# Patient Record
Sex: Male | Born: 1980 | Race: White | Hispanic: No | Marital: Single | Smoking: Never smoker
Health system: Southern US, Community
[De-identification: ages and names within clinical notes are randomized; demographics above are authoritative.]

## PROBLEM LIST (undated history)

## (undated) DIAGNOSIS — J302 Other seasonal allergic rhinitis: Secondary | ICD-10-CM

## (undated) HISTORY — PX: TONSILLECTOMY: SUR1361

## (undated) HISTORY — PX: WISDOM TOOTH EXTRACTION: SHX21

## (undated) HISTORY — PX: OTHER SURGICAL HISTORY: SHX169

---

## 2004-02-11 ENCOUNTER — Emergency Department: Payer: Self-pay | Admitting: Emergency Medicine

## 2014-09-03 ENCOUNTER — Ambulatory Visit
Admission: RE | Admit: 2014-09-03 | Discharge: 2014-09-03 | Disposition: A | Discharge status: Home / Self Care | Payer: Self-pay | Source: Ambulatory Visit | Attending: Family Medicine | Admitting: Family Medicine

## 2014-09-03 ENCOUNTER — Other Ambulatory Visit: Payer: Self-pay | Admitting: Family Medicine

## 2014-09-03 DIAGNOSIS — M25562 Pain in left knee: Secondary | ICD-10-CM

## 2014-10-16 ENCOUNTER — Other Ambulatory Visit: Payer: Self-pay | Admitting: Orthopedic Surgery

## 2014-10-16 DIAGNOSIS — S8392XD Sprain of unspecified site of left knee, subsequent encounter: Secondary | ICD-10-CM

## 2014-10-20 ENCOUNTER — Ambulatory Visit
Admission: RE | Admit: 2014-10-20 | Discharge: 2014-10-20 | Disposition: A | Discharge status: Home / Self Care | Payer: 59 | Source: Ambulatory Visit | Attending: Orthopedic Surgery | Admitting: Orthopedic Surgery

## 2014-10-20 DIAGNOSIS — X58XXXD Exposure to other specified factors, subsequent encounter: Secondary | ICD-10-CM | POA: Insufficient documentation

## 2014-10-20 DIAGNOSIS — S83512D Sprain of anterior cruciate ligament of left knee, subsequent encounter: Secondary | ICD-10-CM | POA: Insufficient documentation

## 2014-10-20 DIAGNOSIS — S8392XD Sprain of unspecified site of left knee, subsequent encounter: Secondary | ICD-10-CM

## 2014-11-07 ENCOUNTER — Other Ambulatory Visit (HOSPITAL_COMMUNITY): Payer: Self-pay | Admitting: *Deleted

## 2014-11-07 ENCOUNTER — Encounter (HOSPITAL_COMMUNITY)
Admission: RE | Admit: 2014-11-07 | Discharge: 2014-11-07 | Disposition: A | Discharge status: Home / Self Care | Payer: 59 | Source: Ambulatory Visit | Attending: Orthopedic Surgery | Admitting: Orthopedic Surgery

## 2014-11-07 ENCOUNTER — Encounter (HOSPITAL_COMMUNITY): Payer: Self-pay

## 2014-11-07 DIAGNOSIS — X58XXXA Exposure to other specified factors, initial encounter: Secondary | ICD-10-CM | POA: Diagnosis not present

## 2014-11-07 DIAGNOSIS — S83512A Sprain of anterior cruciate ligament of left knee, initial encounter: Secondary | ICD-10-CM | POA: Diagnosis present

## 2014-11-07 DIAGNOSIS — Y999 Unspecified external cause status: Secondary | ICD-10-CM | POA: Diagnosis not present

## 2014-11-07 DIAGNOSIS — Y929 Unspecified place or not applicable: Secondary | ICD-10-CM | POA: Diagnosis not present

## 2014-11-07 DIAGNOSIS — Y939 Activity, unspecified: Secondary | ICD-10-CM | POA: Diagnosis not present

## 2014-11-07 HISTORY — DX: Other seasonal allergic rhinitis: J30.2

## 2014-11-07 LAB — COMPREHENSIVE METABOLIC PANEL
ALK PHOS: 83 U/L (ref 38–126)
ALT: 63 U/L (ref 17–63)
AST: 49 U/L — ABNORMAL HIGH (ref 15–41)
Albumin: 4 g/dL (ref 3.5–5.0)
Anion gap: 9 (ref 5–15)
BUN: 16 mg/dL (ref 6–20)
CALCIUM: 9.1 mg/dL (ref 8.9–10.3)
CO2: 25 mmol/L (ref 22–32)
Chloride: 105 mmol/L (ref 101–111)
Creatinine, Ser: 1.06 mg/dL (ref 0.61–1.24)
GFR calc Af Amer: >60 mL/min (ref >60)
Glucose, Bld: 86 mg/dL (ref 65–99)
Potassium: 4.1 mmol/L (ref 3.5–5.1)
SODIUM: 139 mmol/L (ref 135–145)
Total Bilirubin: 0.8 mg/dL (ref 0.3–1.2)
Total Protein: 6.4 g/dL — ABNORMAL LOW (ref 6.5–8.1)

## 2014-11-07 LAB — CBC WITH DIFFERENTIAL/PLATELET
Basophils Absolute: 0 10*3/uL (ref 0.0–0.1)
Basophils Relative: 0 % (ref 0–1)
Eosinophils Absolute: 0.2 10*3/uL (ref 0.0–0.7)
Eosinophils Relative: 1 % (ref 0–5)
HCT: 43.6 % (ref 39.0–52.0)
Hemoglobin: 15.3 g/dL (ref 13.0–17.0)
LYMPHS PCT: 29 % (ref 12–46)
Lymphs Abs: 3 10*3/uL (ref 0.7–4.0)
MCH: 31.5 pg (ref 26.0–34.0)
MCHC: 35.1 g/dL (ref 30.0–36.0)
MCV: 89.7 fL (ref 78.0–100.0)
Monocytes Absolute: 0.8 10*3/uL (ref 0.1–1.0)
Monocytes Relative: 7 % (ref 3–12)
NEUTROS PCT: 63 % (ref 43–77)
Neutro Abs: 6.6 10*3/uL (ref 1.7–7.7)
PLATELETS: 247 10*3/uL (ref 150–400)
RBC: 4.86 MIL/uL (ref 4.22–5.81)
RDW: 11.7 % (ref 11.5–15.5)
WBC: 10.5 10*3/uL (ref 4.0–10.5)

## 2014-11-07 LAB — PROTIME-INR
INR: 0.99 (ref 0.00–1.49)
Prothrombin Time: 13.3 seconds (ref 11.6–15.2)

## 2014-11-07 LAB — APTT: APTT: 29 s (ref 24–37)

## 2014-11-07 MED ORDER — CEFAZOLIN SODIUM-DEXTROSE 2-3 GM-% IV SOLR
2.0000 g | INTRAVENOUS | Status: AC
  Administered 2014-11-08: 2 g via INTRAVENOUS

## 2014-11-07 MED ORDER — LACTATED RINGERS IV SOLN
INTRAVENOUS | Status: DC
Start: 2014-11-08 — End: 2014-11-08
  Administered 2014-11-08: 07:00:00 via INTRAVENOUS

## 2014-11-07 NOTE — Pre-Procedure Instructions (Signed)
Douglas CoupChristopher M Roberson  11/07/2014     Your procedure is scheduled on Thursday, November 08, 2014 at 7:30 AM.   [facility] Entr[facility] "A" Admitting Office at 5:30 AM.   Call this number if you have problems the morning of surgery: [phone]    Remember:  Do not eat food or drink liquids after midnight tonight.    Do not wear jewelry.  Do not wear lotions, powders, or cologne.  You may wear deodorant.  Men may shave face and neck.  Do not bring valuables to the hospital.  [facility] is not responsible for any belongings or valuables.  Contacts, dentures or bridgework may not be worn into surgery.  Leave your suitcase in the car.  After surgery it may be brought to your room.  For patients admitted to the hospital, discharge time will be determined by your treatment team.  Patients discharged the day of surgery will not be allowed to drive home.   Special instructions:   - Preparing for Surgery  Before surgery, you can play an important role.  Because skin is not sterile, your skin needs to be as free of germs as possible.  You can reduce the number of germs on you skin by washing with CHG (chlorahexidine gluconate) soap before surgery.  CHG is an antiseptic cleaner which kills germs and bonds with the skin to continue killing germs even after washing.  Please DO NOT use if you have an allergy to CHG or antibacterial soaps.  If your skin becomes reddened/irritated stop using the CHG and inform your nurse when you arrive at Short Stay.  Do not shave (including legs and underarms) for at least 48 hours prior to the first CHG shower.  You may shave your face.  Please follow these instructions carefully:   1.  Shower with CHG Soap the night before surgery and the                                morning of Surgery.  2.  If you choose to wash your hair, wash your hair first as usual with your       normal shampoo.  3.  After you shampoo, rinse your hair and  body thoroughly to remove the                      Shampoo.  4.  Use CHG as you would any other liquid soap.  You can apply chg directly       to the skin and wash gently with scrungie or a clean washcloth.  5.  Apply the CHG Soap to your body ONLY FROM THE NECK DOWN.        Do not use on open wounds or open sores.  Avoid contact with your eyes, ears, mouth and genitals (private parts).  Wash genitals (private parts) with your normal soap.  6.  Wash thoroughly, paying special attention to the area where your surgery        will be performed.  7.  Thoroughly rinse your body with warm water from the neck down.  8.  DO NOT shower/wash with your normal soap after using and rinsing off       the CHG Soap.  9.  Pat yourself dry with a clean towel.            10.  Wear  clean pajamas.            11.  Place clean sheets on your bed the night of your first shower and do not        sleep with pets.  Day of Surgery  Do not apply any lotions the morning of surgery.  Please wear clean clothes to the hospital.    Please read over the following fact sheets that you were given. Pain Booklet, Coughing and Deep Breathing and Surgical Site Infection Prevention

## 2014-11-08 ENCOUNTER — Encounter (HOSPITAL_COMMUNITY): Payer: Self-pay | Admitting: *Deleted

## 2014-11-08 ENCOUNTER — Ambulatory Visit (HOSPITAL_COMMUNITY)
Admission: RE | Admit: 2014-11-08 | Discharge: 2014-11-08 | Disposition: A | Discharge status: Home / Self Care | Payer: 59 | Source: Ambulatory Visit | Attending: Orthopedic Surgery | Admitting: Orthopedic Surgery

## 2014-11-08 ENCOUNTER — Encounter (HOSPITAL_COMMUNITY)
Admission: RE | Disposition: A | Discharge status: Home / Self Care | Payer: Self-pay | Source: Ambulatory Visit | Attending: Orthopedic Surgery

## 2014-11-08 ENCOUNTER — Ambulatory Visit (HOSPITAL_COMMUNITY): Payer: 59 | Admitting: Anesthesiology

## 2014-11-08 DIAGNOSIS — X58XXXA Exposure to other specified factors, initial encounter: Secondary | ICD-10-CM | POA: Insufficient documentation

## 2014-11-08 DIAGNOSIS — Y999 Unspecified external cause status: Secondary | ICD-10-CM | POA: Insufficient documentation

## 2014-11-08 DIAGNOSIS — Y929 Unspecified place or not applicable: Secondary | ICD-10-CM | POA: Insufficient documentation

## 2014-11-08 DIAGNOSIS — S83512A Sprain of anterior cruciate ligament of left knee, initial encounter: Secondary | ICD-10-CM | POA: Insufficient documentation

## 2014-11-08 DIAGNOSIS — Z9889 Other specified postprocedural states: Secondary | ICD-10-CM

## 2014-11-08 DIAGNOSIS — Y939 Activity, unspecified: Secondary | ICD-10-CM | POA: Insufficient documentation

## 2014-11-08 HISTORY — PX: ANTERIOR CRUCIATE LIGAMENT REPAIR: SHX115

## 2014-11-08 SURGERY — RECONSTRUCTION, KNEE, ACL
Anesthesia: General | Site: Knee | Laterality: Left

## 2014-11-08 MED ORDER — DIAZEPAM 5 MG PO TABS
2.5000 mg | ORAL_TABLET | Freq: Four times a day (QID) | ORAL | Status: DC | PRN
Start: 2014-11-08 — End: 2023-03-05

## 2014-11-08 MED ORDER — SODIUM CHLORIDE 0.9 % IR SOLN
Status: DC | PRN
  Administered 2014-11-08: 9000 mL

## 2014-11-08 MED ORDER — NAPROXEN 500 MG PO TABS: 500.0000 mg | ORAL_TABLET | Freq: Two times a day (BID) | ORAL | Status: DC

## 2014-11-08 MED ORDER — EPHEDRINE SULFATE 50 MG/ML IJ SOLN
INTRAMUSCULAR | Status: AC
  Filled 2014-11-08: qty 1

## 2014-11-08 MED ORDER — ONDANSETRON HCL 4 MG PO TABS: 4.0000 mg | ORAL_TABLET | Freq: Three times a day (TID) | ORAL | Status: DC | PRN

## 2014-11-08 MED ORDER — MORPHINE SULFATE 4 MG/ML IJ SOLN
INTRAMUSCULAR | Status: DC | PRN
  Administered 2014-11-08: 4 mg

## 2014-11-08 MED ORDER — MORPHINE SULFATE 4 MG/ML IJ SOLN
INTRAMUSCULAR | Status: AC
  Filled 2014-11-08: qty 1

## 2014-11-08 MED ORDER — PROPOFOL 10 MG/ML IV BOLUS
INTRAVENOUS | Status: AC
  Filled 2014-11-08: qty 20

## 2014-11-08 MED ORDER — LIDOCAINE HCL (CARDIAC) 20 MG/ML IV SOLN
INTRAVENOUS | Status: AC
  Filled 2014-11-08: qty 5

## 2014-11-08 MED ORDER — BUPIVACAINE HCL 0.25 % IJ SOLN
INTRAMUSCULAR | Status: DC | PRN
  Administered 2014-11-08: 20 mL via INTRA_ARTICULAR

## 2014-11-08 MED ORDER — MIDAZOLAM HCL 5 MG/5ML IJ SOLN
INTRAMUSCULAR | Status: DC | PRN
  Administered 2014-11-08: 2 mg via INTRAVENOUS

## 2014-11-08 MED ORDER — HYDROMORPHONE HCL 1 MG/ML IJ SOLN
0.2500 mg | INTRAMUSCULAR | Status: DC | PRN
  Administered 2014-11-08: 0.5 mg via INTRAVENOUS

## 2014-11-08 MED ORDER — SODIUM CHLORIDE 0.9 % IJ SOLN
INTRAMUSCULAR | Status: AC
  Filled 2014-11-08: qty 10

## 2014-11-08 MED ORDER — OXYCODONE-ACETAMINOPHEN 5-325 MG PO TABS: 1.0000 | ORAL_TABLET | ORAL | Status: DC | PRN

## 2014-11-08 MED ORDER — FENTANYL CITRATE (PF) 250 MCG/5ML IJ SOLN
INTRAMUSCULAR | Status: AC
  Filled 2014-11-08: qty 5

## 2014-11-08 MED ORDER — PROMETHAZINE HCL 25 MG/ML IJ SOLN: 6.2500 mg | INTRAMUSCULAR | Status: DC | PRN

## 2014-11-08 MED ORDER — BUPIVACAINE HCL (PF) 0.25 % IJ SOLN
INTRAMUSCULAR | Status: AC
  Filled 2014-11-08: qty 30

## 2014-11-08 MED ORDER — ONDANSETRON HCL 4 MG/2ML IJ SOLN
INTRAMUSCULAR | Status: AC
  Filled 2014-11-08: qty 2

## 2014-11-08 MED ORDER — PROPOFOL 10 MG/ML IV BOLUS
INTRAVENOUS | Status: DC | PRN
  Administered 2014-11-08: 200 mg via INTRAVENOUS

## 2014-11-08 MED ORDER — FENTANYL CITRATE (PF) 100 MCG/2ML IJ SOLN
INTRAMUSCULAR | Status: DC | PRN
  Administered 2014-11-08 (×2): 50 ug via INTRAVENOUS
  Administered 2014-11-08: 100 ug via INTRAVENOUS
  Administered 2014-11-08: 50 ug via INTRAVENOUS

## 2014-11-08 MED ORDER — BUPIVACAINE-EPINEPHRINE (PF) 0.5% -1:200000 IJ SOLN
INTRAMUSCULAR | Status: DC | PRN
  Administered 2014-11-08: 30 mL via PERINEURAL

## 2014-11-08 MED ORDER — CHLORHEXIDINE GLUCONATE 4 % EX LIQD: 60.0000 mL | Freq: Once | CUTANEOUS | Status: DC

## 2014-11-08 MED ORDER — HYDROMORPHONE HCL 1 MG/ML IJ SOLN
INTRAMUSCULAR | Status: AC
  Filled 2014-11-08: qty 1

## 2014-11-08 MED ORDER — ONDANSETRON HCL 4 MG/2ML IJ SOLN
INTRAMUSCULAR | Status: DC | PRN
  Administered 2014-11-08: 4 mg via INTRAVENOUS

## 2014-11-08 MED ORDER — SUCCINYLCHOLINE CHLORIDE 20 MG/ML IJ SOLN
INTRAMUSCULAR | Status: AC
  Filled 2014-11-08: qty 1

## 2014-11-08 MED ORDER — MIDAZOLAM HCL 2 MG/2ML IJ SOLN
INTRAMUSCULAR | Status: AC
  Filled 2014-11-08: qty 2

## 2014-11-08 MED ORDER — ROCURONIUM BROMIDE 50 MG/5ML IV SOLN
INTRAVENOUS | Status: AC
  Filled 2014-11-08: qty 1

## 2014-11-08 SURGICAL SUPPLY — 72 items
BANDAGE ELASTIC 6 VELCRO ST LF (GAUZE/BANDAGES/DRESSINGS) ×3 IMPLANT
BANDAGE ESMARK 6X9 LF (GAUZE/BANDAGES/DRESSINGS) ×1 IMPLANT
BIOSCREW 8X20 (Screw) ×3 IMPLANT
BIOSCREW 8X25 (Screw) ×3 IMPLANT
BLADE CUTTER GATOR 3.5 (BLADE) ×3 IMPLANT
BLADE GREAT WHITE 4.2 (BLADE) ×2 IMPLANT
BLADE GREAT WHITE 4.2MM (BLADE) ×1
BLADE SURG 15 STRL LF DISP TIS (BLADE) ×2 IMPLANT
BLADE SURG 15 STRL SS (BLADE) ×4
BNDG ESMARK 6X9 LF (GAUZE/BANDAGES/DRESSINGS) ×3
BONE TUNNEL PLUG CANNULATED (MISCELLANEOUS) ×3 IMPLANT
BOVIE (MISCELLANEOUS) ×3 IMPLANT
BUR OVAL 6.0 (BURR) ×3 IMPLANT
CLOSURE STERI-STRIP 1/2X4 (GAUZE/BANDAGES/DRESSINGS) ×1
CLOSURE WOUND 1/2 X4 (GAUZE/BANDAGES/DRESSINGS) ×1
CLSR STERI-STRIP ANTIMIC 1/2X4 (GAUZE/BANDAGES/DRESSINGS) ×2 IMPLANT
COVER SURGICAL LIGHT HANDLE (MISCELLANEOUS) ×6 IMPLANT
CUFF TOURNIQUET SINGLE 34IN LL (TOURNIQUET CUFF) IMPLANT
CUTTER KNOT PUSHER 2-0 FIBERWI (INSTRUMENTS) IMPLANT
DRAPE ARTHROSCOPY W/POUCH 114 (DRAPES) ×3 IMPLANT
DRAPE INCISE IOBAN 66X45 STRL (DRAPES) ×3 IMPLANT
DRAPE PROXIMA HALF (DRAPES) ×3 IMPLANT
DRSG PAD ABDOMINAL 8X10 ST (GAUZE/BANDAGES/DRESSINGS) ×3 IMPLANT
DURAPREP 26ML APPLICATOR (WOUND CARE) ×3 IMPLANT
ELECT REM PT RETURN 9FT ADLT (ELECTROSURGICAL) ×3
ELECTRODE REM PT RTRN 9FT ADLT (ELECTROSURGICAL) ×1 IMPLANT
GAUZE SPONGE 4X4 12PLY STRL (GAUZE/BANDAGES/DRESSINGS) ×3 IMPLANT
GLOVE BIO SURGEON STRL SZ7.5 (GLOVE) ×3 IMPLANT
GLOVE BIO SURGEON STRL SZ8 (GLOVE) ×3 IMPLANT
GLOVE EUDERMIC 7 POWDERFREE (GLOVE) ×3 IMPLANT
GLOVE SS BIOGEL STRL SZ 7.5 (GLOVE) ×1 IMPLANT
GLOVE SUPERSENSE BIOGEL SZ 7.5 (GLOVE) ×2
GOWN STRL REUS W/ TWL LRG LVL3 (GOWN DISPOSABLE) ×1 IMPLANT
GOWN STRL REUS W/ TWL XL LVL3 (GOWN DISPOSABLE) ×2 IMPLANT
GOWN STRL REUS W/TWL LRG LVL3 (GOWN DISPOSABLE) ×2
GOWN STRL REUS W/TWL XL LVL3 (GOWN DISPOSABLE) ×4
IMMOBILIZER KNEE 22  40 CIR (ORTHOPEDIC SUPPLIES) ×2
IMMOBILIZER KNEE 22 40 CIR (ORTHOPEDIC SUPPLIES) ×1 IMPLANT
IMMOBILIZER KNEE 22 UNIV (SOFTGOODS) ×3 IMPLANT
KIT BASIN OR (CUSTOM PROCEDURE TRAY) ×3 IMPLANT
KIT ROOM TURNOVER OR (KITS) ×3 IMPLANT
KIT TRANSTIBIAL (DISPOSABLE) ×3 IMPLANT
KNIFE GRAFT ACL 10MM 5952 (MISCELLANEOUS) IMPLANT
NEEDLE 22X1 1/2 (OR ONLY) (NEEDLE) ×3 IMPLANT
PACK ARTHROSCOPY DSU (CUSTOM PROCEDURE TRAY) ×3 IMPLANT
PAD ARMBOARD 7.5X6 YLW CONV (MISCELLANEOUS) ×6 IMPLANT
PADDING CAST COTTON 6X4 STRL (CAST SUPPLIES) ×3 IMPLANT
PASSER SUT SWANSON 36MM LOOP (INSTRUMENTS) ×3 IMPLANT
SCREW BIO 8X25 (Screw) ×1 IMPLANT
SET ARTHROSCOPY TUBING (MISCELLANEOUS) ×2
SET ARTHROSCOPY TUBING LN (MISCELLANEOUS) ×1 IMPLANT
SPONGE GAUZE 4X4 12PLY STER LF (GAUZE/BANDAGES/DRESSINGS) ×3 IMPLANT
SPONGE LAP 4X18 X RAY DECT (DISPOSABLE) ×6 IMPLANT
STRIP CLOSURE SKIN 1/2X4 (GAUZE/BANDAGES/DRESSINGS) ×2 IMPLANT
SUCTION FRAZIER TIP 10 FR DISP (SUCTIONS) ×3 IMPLANT
SUT FIBERWIRE #2 38 T-5 BLUE (SUTURE) ×12
SUT MENISCAL KIT (KITS) IMPLANT
SUT MNCRL AB 3-0 PS2 18 (SUTURE) ×3 IMPLANT
SUT PDS 2 0 CTB 1 36 (SUTURE) IMPLANT
SUT PDS AB 0 CT 36 (SUTURE) IMPLANT
SUT VIC AB 2-0 CT1 27 (SUTURE) ×4
SUT VIC AB 2-0 CT1 TAPERPNT 27 (SUTURE) ×2 IMPLANT
SUT VIC AB 3-0 FS2 27 (SUTURE) ×3 IMPLANT
SUTURE FIBERWR #2 38 T-5 BLUE (SUTURE) ×4 IMPLANT
SYR 20ML ECCENTRIC (SYRINGE) ×3 IMPLANT
SYR 5ML LL (SYRINGE) ×3 IMPLANT
SYR BULB IRRIGATION 50ML (SYRINGE) ×3 IMPLANT
SYR CONTROL 10ML LL (SYRINGE) ×3 IMPLANT
SYR TB 1ML LUER SLIP (SYRINGE) ×3 IMPLANT
TOWEL OR 17X24 6PK STRL BLUE (TOWEL DISPOSABLE) ×3 IMPLANT
TOWEL OR 17X26 10 PK STRL BLUE (TOWEL DISPOSABLE) ×3 IMPLANT
WRAP KNEE MAXI GEL POST OP (GAUZE/BANDAGES/DRESSINGS) ×3 IMPLANT

## 2014-11-08 NOTE — Anesthesia Postprocedure Evaluation (Signed)
Anesthesia Post Note  Patient: Douglas Roberson  Procedure(s) Performed: Procedure(s) (LRB): LEFT KNEE ARTHROSCOPY AUTOGRAFT ANTERIOR CRUCIATE LIGAMENT (ACL) RECONSTRUCTION, POSSIBLE PARTIAL MENISECTOMY VS REPAIR (Left)  Anesthesia type: general  Patient location: PACU  Post pain: Pain level controlled  Post assessment: Patient's Cardiovascular Status Stable  Last Vitals:  Filed Vitals:   11/08/14 1055  BP: 113/66  Pulse: 64  Temp:   Resp: 14    Post vital signs: Reviewed and stable  Level of consciousness: sedated  Complications: No apparent anesthesia complications

## 2014-11-08 NOTE — Op Note (Signed)
11/08/2014  10:01 AM  PATIENT:   Douglas Roberson  34 y.o. male  PRE-OPERATIVE DIAGNOSIS:  LEFT KNEE ACL RUPTURE  POST-OPERATIVE DIAGNOSIS:  same  PROCEDURE:  LKA, Autograft ACL recon  SURGEON:  Connie Lasater, Vania ReaKevin M. M.D.  ASSISTANTS: Shuford pac   ANESTHESIA:   GET + FNB  EBL: min  SPECIMEN:  none  Drains: none  TT aproxx 75 min, exact time available from anesthesia record   PATIENT DISPOSITION:  PACU - hemodynamically stable.    PLAN OF CARE: Discharge to home after PACU  Dictation# [id number]   Contact # [phone]

## 2014-11-08 NOTE — Discharge Instructions (Signed)
Vania ReaKevin M. Supple, M.D., F.A.A.O.S. Orthopaedic Surgery Specializing in Arthroscopic and Reconstructive Surgery of the Shoulder and Knee [phone] [address] [place], [place] [address] - Fax [phone]   POST-OP ACL RECONSTRUCTION  1. Call the office at [phone] to schedule your first post-op appointment 7-10 days from the date of your surgery.  2. Leave the steri-strips in place over your incisions when performing dressing changes and showering. Remove your dressings tomorrow to perform first dressings change. Then change dressing daily or as needed. You may shower and get incision wet on day 5 from surgery. Do not submerge incision in tub or under water.  3. Wear your knee immobilizer or hinged knee brace to sleep and at all times while up. You may put full weight on operative leg with brace on. Crutches are for comfort. Remove brace to perform attached exercises.  4. It is strongly recommended to ice and elevate your leg on a regular basis and at a minimum of 4 times a day for 30 minute sessions. You should ice after your exercise sessions and more if you are having a lot of pain or increase in swelling.  5. The thigh high Ted hose (white stockings) help to decrease swelling and prevent blood clots. It is recommended that you wear them on both legs for the first 3 days. Then you should wear on the operative extremity when upright but can remove to sleep.  6. If you had a block pre-operatively to provide post-op pain relief you may want to go ahead and begin utilizing your pain meds as your arm begins to wake up. Blocks can sometimes last up to 16-18 hours. If you are still pain-free prior to going to bed you may want to strongly consider taking a pain medication to avoid being awakened in the night with the onset of pain. A muscle relaxant is also provided for you should you experience muscle spasms. It is recommended that if you are experiencing pain that your pain  medication alone is not controlling, add the muscle relaxant along with the pain medication which can give additional pain relief. The first one to two days is generally the most severe of your pain and then should gradually decrease. As your pain lessens it is recommended that you decrease your use of the pain medications to an "as needed basis" only and to always comply with the recommended dosages of the pain medications.  7. Pain medications can produce constipation along with their use. If you experience this, the use of an over the counter stool softener or laxative daily is recommended.   8. For additional questions or concerns, please do not hesitate to call the office. If after hours there is an answering service to forward your concerns to the physician on call.   POST-OP EXERCISES  Repeat each exercise 10 times per set. Do 3 sets per session. Do 3 sessions per day.  Ankle/Foot Range of Motion  With leg relaxed, gently flex and extend ankle. Move through full range of motion.     Knee Extension Mobilization: Towel Prop  With a small towel rolled under your ankle, relax your leg to feel a comfortable stretch along the backside of your knee/leg. Hold for 3-5 minutes.     Hip/Knee Strengthening: Quadriceps Set  Tighten your quadriceps (top of thigh) by pulling your patella (kneecap) toward your hip. Keep your buttocks relaxed. Hold for 5-10 seconds.     Hip/Knee Strengthening: Straight Leg Raise  With  your brace on, tighten your quadriceps and lift your leg 12-18 inches from surface. ° ° ° ° °Hip/Knee Stretching: Calf - Towel ° °Sit with knee straight and towel looped around your foot. Gently pull on towel until stretch is felt in calf. Hold for 20-30 seconds. ° ° ° ° °Hip/Knee: Knee Flexion ° °With a towel around your heel, gently pull knee up with towel until stretch is felt. Hold 20-30 seconds. ° ° ° ° ° ° ° ° ° °

## 2014-11-08 NOTE — Anesthesia Preprocedure Evaluation (Addendum)
Anesthesia Evaluation  Patient identified by MRN, date of birth, ID band Patient awake    Reviewed: Allergy & Precautions, NPO status , Patient's Chart, lab work & pertinent test results  Airway Mallampati: II       Dental  (+) Teeth Intact, Dental Advisory Given   Pulmonary neg pulmonary ROS,    Pulmonary exam normal       Cardiovascular negative cardio ROS Normal cardiovascular exam    Neuro/Psych negative neurological ROS  negative psych ROS   GI/Hepatic negative GI ROS, Neg liver ROS,   Endo/Other  Morbid obesity  Renal/GU negative Renal ROS  negative genitourinary   Musculoskeletal   Abdominal   Peds negative pediatric ROS (+)  Hematology negative hematology ROS (+)   Anesthesia Other Findings   Reproductive/Obstetrics negative OB ROS                           Anesthesia Physical Anesthesia Plan  ASA: II  Anesthesia Plan: General   Post-op Pain Management:    Induction: Intravenous  Airway Management Planned: LMA  Additional Equipment:   Intra-op Plan:   Post-operative Plan: Extubation in OR  Informed Consent: I have reviewed the patients History and Physical, chart, labs and discussed the procedure including the risks, benefits and alternatives for the proposed anesthesia with the patient or authorized representative who has indicated his/her understanding and acceptance.   Dental advisory given  Plan Discussed with: CRNA, Anesthesiologist and Surgeon  Anesthesia Plan Comments:       Anesthesia Quick Evaluation

## 2014-11-08 NOTE — Transfer of Care (Signed)
Immediate Anesthesia Transfer of Care Note  Patient: Douglas CoupChristopher M Roberson  Procedure(s) Performed: Procedure(s): LEFT KNEE ARTHROSCOPY AUTOGRAFT ANTERIOR CRUCIATE LIGAMENT (ACL) RECONSTRUCTION, POSSIBLE PARTIAL MENISECTOMY VS REPAIR (Left)  Patient Location: PACU  Anesthesia Type:General  Level of Consciousness: awake, alert  and oriented  Airway & Oxygen Therapy: Patient Spontanous Breathing and Patient connected to nasal cannula oxygen  Post-op Assessment: Report given to RN, Post -op Vital signs reviewed and stable and Patient moving all extremities X 4  Post vital signs: Reviewed and stable  Last Vitals:  Filed Vitals:   11/08/14 0601  BP: 146/78  Pulse: 78  Temp: 36.9 C  Resp: 18    Complications: No apparent anesthesia complications

## 2014-11-08 NOTE — Anesthesia Procedure Notes (Addendum)
Anesthesia Regional Block:  Femoral nerve block  Pre-Anesthetic Checklist: ,, timeout performed, Correct Patient, Correct Site, Correct Laterality, Correct Procedure, Correct Position, site marked, Risks and benefits discussed,  Surgical consent,  Pre-op evaluation,  At surgeon's request and post-op pain management  Laterality: Left  Prep: chloraprep       Needles:  Injection technique: Single-shot  Needle Type: Echogenic Stimulator Needle     Needle Length: 5cm 5 cm Needle Gauge: 22 and 22 G    Additional Needles:  Procedures: ultrasound guided (picture in chart) and nerve stimulator Femoral nerve block  Nerve Stimulator or Paresthesia:  Response: quadraceps contraction, 0.45 mA,   Additional Responses:   Narrative:  Start time: 11/08/2014 7:01 AM End time: 11/08/2014 7:11 AM Injection made incrementally with aspirations every 5 mL.  Performed by: Personally  Anesthesiologist: Heather RobertsSINGER, JAMES  Additional Notes: Functioning IV was confirmed and monitors were applied.  A 50mm 22ga Arrow echogenic stimulator needle was used. Sterile prep and drape,hand hygiene and sterile gloves were used. Ultrasound guidance: relevant anatomy identified, needle position confirmed, local anesthetic spread visualized around nerve(s)., vascular puncture avoided.  Image printed for medical record. Negative aspiration and negative test dose prior to incremental administration of local anesthetic. The patient tolerated the procedure well.     Procedure Name: LMA Insertion Date/Time: 11/08/2014 7:35 AM Performed by: Sharlene DoryWALKER, Kammi Hechler E Pre-anesthesia Checklist: Patient identified, Emergency Drugs available, Suction available, Patient being monitored and Timeout performed Patient Re-evaluated:Patient Re-evaluated prior to inductionOxygen Delivery Method: Circle system utilized Preoxygenation: Pre-oxygenation with 100% oxygen LMA: LMA inserted LMA Size: 4.0 Number of attempts: 1 Placement Confirmation:  positive ETCO2 and breath sounds checked- equal and bilateral Tube secured with: Tape Dental Injury: Teeth and Oropharynx as per pre-operative assessment

## 2014-11-08 NOTE — Progress Notes (Signed)
Orthopedic Tech Progress Note Patient Details:  Stephanie CoupChristopher M [id number] March 26, 1981 [id number]  Ortho Devices Type of Ortho Device: Crutches Ortho Device/Splint Interventions: Application   Nikki DomCrawford, Avry Roedl 11/08/2014, 12:05 PM

## 2014-11-08 NOTE — H&P (Signed)
Stephanie CoupChristopher M Gorman    Chief Complaint: LEFT KNEE ACL RUPTURE HPI: The patient is a 34 y.o. male with left knee ACL rupture and symptomatic instability  Past Medical History  Diagnosis Date  . Seasonal allergies     Past Surgical History  Procedure Laterality Date  . Tonsillectomy    . Wisdom tooth extraction      Family History  Problem Relation Age of Onset  . Hypertension Mother   . CAD Mother     Social History:  reports that he has never smoked. He has never used smokeless tobacco. He reports that he drinks alcohol. He reports that he does not use illicit drugs.  Allergies: No Known Allergies  No prescriptions prior to admission     Physical Exam: left knee with ACL laxity as noted at recent office visits  Vitals  Temp:  [98.4 F (36.9 C)] 98.4 F (36.9 C) (07/14 0601) Pulse Rate:  [59-88] 81 (07/14 0724) Resp:  [18-20] 18 (07/14 0601) BP: (144-146)/(76-78) 146/78 mmHg (07/14 0601) SpO2:  [98 %-100 %] 98 % (07/14 0601) Weight:  [117.754 kg (259 lb 9.6 oz)] 117.754 kg (259 lb 9.6 oz) (07/14 0601)  Assessment/Plan  Impression: LEFT KNEE ACL RUPTURE  Plan of Action: Procedure(s): LEFT KNEE ARTHROSCOPY AUTOGRAFT ANTERIOR CRUCIATE LIGAMENT (ACL) RECONSTRUCTION, POSSIBLE PARTIAL MENISECTOMY VS REPAIR  Tene Gato M 11/08/2014, 7:26 AM Contact # [phone]

## 2014-11-09 ENCOUNTER — Encounter (HOSPITAL_COMMUNITY): Payer: Self-pay | Admitting: Orthopedic Surgery

## 2014-11-09 NOTE — Op Note (Signed)
NAMLazaro Arms:  Roberson, Douglas         ACCOUNT NO.:  [id number]  MEDICAL RECORD NO.:  [id number]  LOCATION:  [facility]                         FACILITY:  [facility]  PHYSICIAN:  Douglas Roberson, M.D.  DATE OF BIRTH:  14-Oct-1980  DATE OF PROCEDURE:  11/08/2014 DATE OF DISCHARGE:  11/08/2014                              OPERATIVE REPORT   PREOPERATIVE DIAGNOSIS:  Left knee anterior cruciate ligament rupture with symptomatic instability.  POSTOPERATIVE DIAGNOSIS:  Left knee anterior cruciate ligament rupture with symptomatic instability.  PROCEDURES: 1. Left knee diagnostic arthroscopy. 2. Autograft, anterior cruciate ligament reconstruction using bone     patellar tendon and bone autograft.  SURGEON:  Douglas ReaKevin M. Stephenie Navejas, M.D.  Threasa HeadsASSISTANFrench Ana:  Douglas A. Shuford, PA-C.  ANESTHESIA:  General endotracheal as well as a femoral nerve block.  TOURNIQUET TIME:  Approximately 75 minutes, exact time available from the anesthesia record.  ESTIMATED BLOOD LOSS:  Minimal.  DRAINS:  None.  HISTORY:  Douglas Roberson is a 34 year old gentleman who injured the left knee sustaining an ACL rupture, is now developed symptomatic instability.  Due to his ongoing pain and functional limitations, he is brought to the operating room at this time for planned left knee ACL reconstruction as described below.  Preoperatively, I counseled Douglas Roberson regarding treatment options and potential risks versus benefits thereof.  Possible surgical complications were all reviewed including bleeding, infection, neurovascular injury, DVT, PE, arthrofibrosis, persistent pain, recurrence of instability, possible need for additional surgery.  He understands and accepts, and agrees to the planned procedure.  PROCEDURE IN DETAIL:  After undergoing routine preop evaluation, the patient received prophylactic antibiotics and a femoral nerve block was established in the holding area by the Anesthesia Department.  Placed supine on the  operating table, underwent smooth induction of a general endotracheal anesthesia.  Tourniquet was applied to the left thigh. Left lower leg was sterilely prepped and draped in standard fashion. Time-out was called.  The left lower extremity was exsanguinated with the tourniquet inflated to 350 mmHg.  A 6-cm anterior midline incision was then made, centered over the infrapatellar tendon.  Skin flaps were elevated and circumferentially mobilized and electrocautery was used for hemostasis.  The paratenon was then divided exposing the infrapatellar tendon.  The central third was then harvested with a double blade 10-mm wide knife.  The bone plugs were harvested from the adjacent aspect of the patella and tibial tubercle.  The graft was then taken to the back table and fashioned to fit through 10-mm tunnels.  Standard arthroscopy portals were then established and diagnostic arthroscopy was performed. The suprapatellar pouch and gutters were clear of any loose bodies. Patellofemoral joint showed normal articular cartilage.  Intercondylar notch confirmed complete rupture of the ACL.  The ruptured portion predominantly, distally adjacent to the tibial insertion.  PCL was intact.  The remnant of the ACL was removed with a shaver.  We then carefully evaluated the medial and lateral compartments, which showed articular surfaces to be in excellent condition and the menisci were both stable and intact.  We turned our attention to the intercondylar notch.  We performed a notchplasty with combination of osteotome and bur.  All residual bony debris was meticulously removed.  At this  point, we used a tibial guide to bring a guidepin up through the footprint of the ACL and overdrilled this with a 10-mm reamer.  Tunnel orifices were carefully cleared.  Knee was then flexed to approximately 130 degrees for a medial portal.  We placed a guidepin up into the anatomic insertion of the ACL and then overdrilled  this with a low-profile 10-mm reamer to a 25-mm depth.  Care was taken to confirm proper thickness of the posterior femoral tunnel wall.  At this time, the tunnels were meticulously cleaned, bony debris was removed.  A two-pin passer directed through the femoral tunnel, brought out through stab wound on lateral thigh.  Guidewire was passed.  We then shuttled a passing suture through the femoral tunnel to the cubital tunnel to sees the shuttler graft sutures into position.  The graft was then pulled in position with excellent interference fit of the bone plugs both proximally and distally.  An 8 x 20-mm BioScrew was threaded over the guidewire into the femoral tunnel with excellent fit and fixation.  Guidewire was removed, taken the knee through range of motion and showing position of the graft.  We did perform some additional lateral wall notchplasty to avoid impingement of our graft and ultimately very pleased with the excursion and isometry of the graft.  Knee was then placed in 20 degrees of flexion.  The graft was tensioned.  An 8 x 25-mm screw was placed into the tibial tunnel over the guidewire with excellent fit and fixation.  At this point, the intraoperative block and epiphysis were negative.  The graft was inspected and probed, showing good stability and no impingement.  At this point, final inspection, irrigation and debridement were then completed.  Fluid and instruments were removed. The paratenon was then repaired with a running 2-0 Vicryl, interrupted 2- 0 Vicryl for the subcu layer, intracuticular 3-0 Monocryl for the skin followed by Steri-Strips.  Dry dressings were wrapped at the left knee, was wrapped with an Ace bandage, thigh-high support stocking, knee immobilizer and tourniquet was then let down.  The patient was awakened, extubated, and taken to the recovery room in stable condition.  Douglas A. Shuford, PA-C, was used as an Geophysicist/field seismologist throughout this  case, essential for help with positioning of the patient, positioning of the extremity, graft preparation, tissue manipulation, wound closer and intraoperative decision making.     Douglas Roberson, M.D.     KMS/MEDQ  D:  11/08/2014  T:  11/09/2014  Job:  [id number]

## 2016-01-29 IMAGING — CR DG KNEE COMPLETE 4+V*L*
3 series · 3 of 3 positions shown · non-contrast
Comparison: None.

CLINICAL DATA: Pain following extension type injury 5 days prior

EXAM:
LEFT KNEE - COMPLETE 4+ VIEW

[w knee ap left]
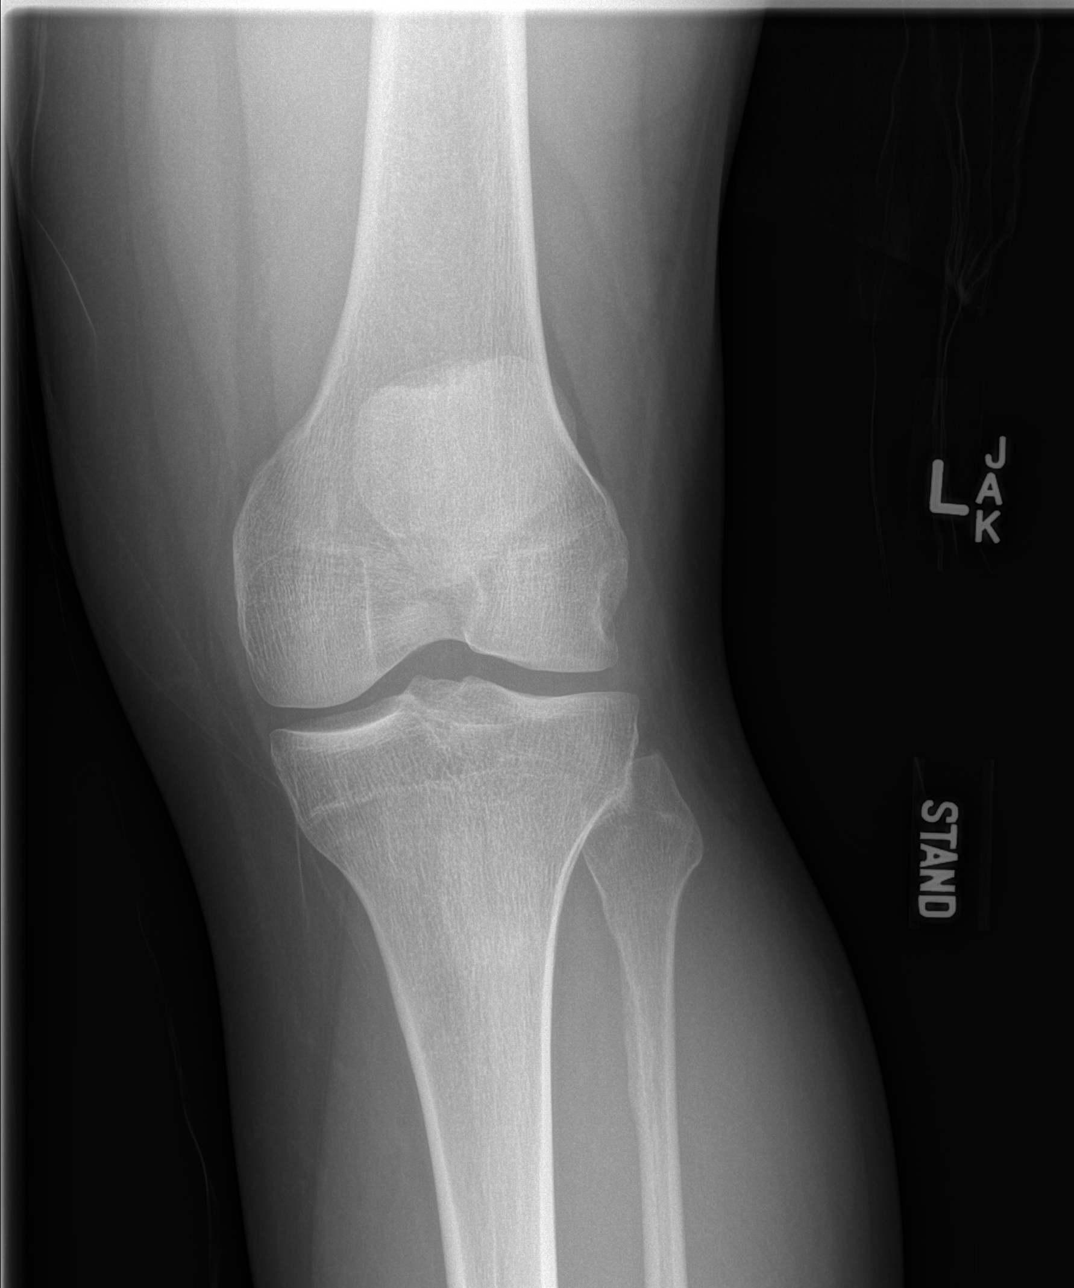

[w knee obl. left (1 of 2)]
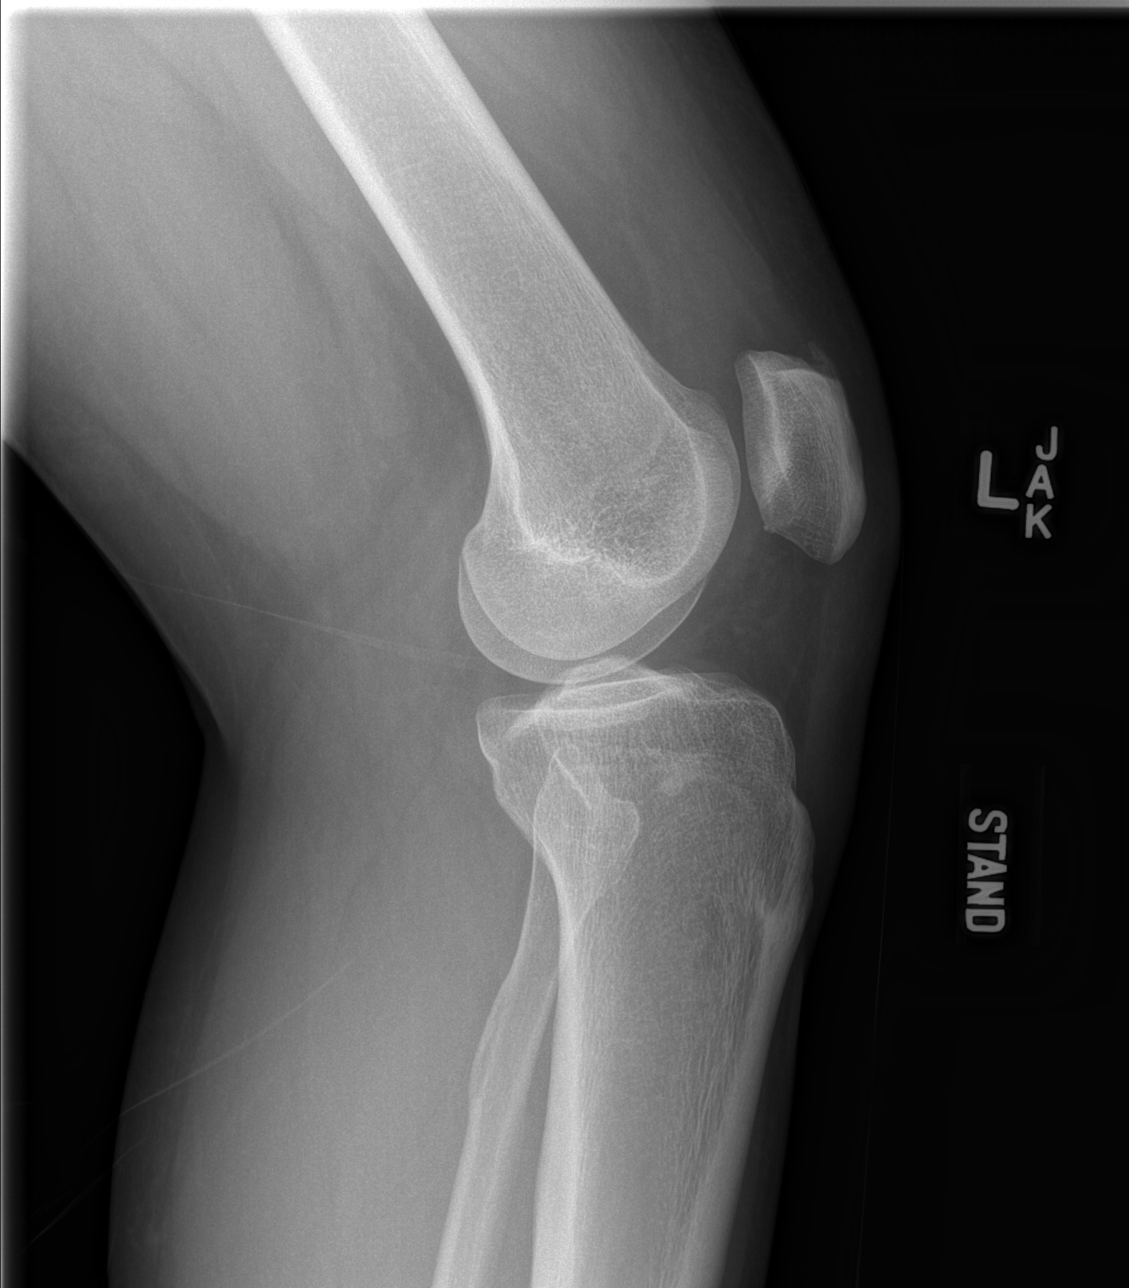

[w knee obl. left (2 of 2)]
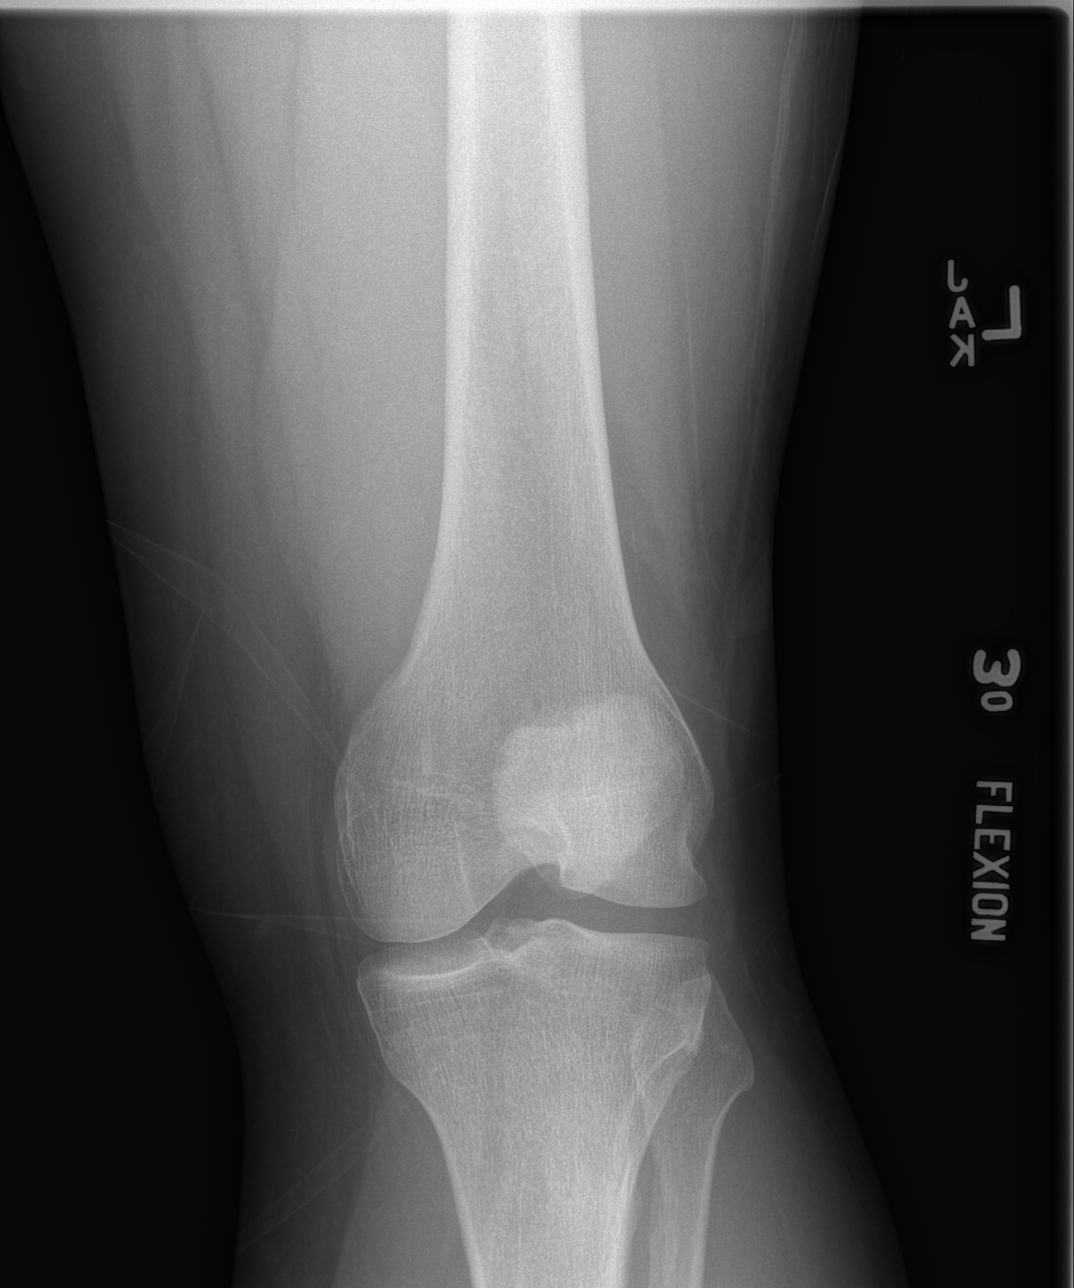

[3 of 3 positions shown; findings below may reference images not displayed]

FINDINGS: Standing frontal, standing tunnel, standing lateral, and sunrise
patellar images obtained. There is no fracture or dislocation. No
joint effusion. Joint spaces appear intact. There is a spur arising
from the anterior superior patella.
IMPRESSION: Probable distal quadriceps tendinosis. No fracture or joint
effusion. No appreciable joint space narrowing.

## 2023-01-16 ENCOUNTER — Telehealth: Payer: BC Managed Care – PPO | Admitting: Nurse Practitioner

## 2023-01-16 DIAGNOSIS — B9689 Other specified bacterial agents as the cause of diseases classified elsewhere: Secondary | ICD-10-CM

## 2023-01-16 DIAGNOSIS — J019 Acute sinusitis, unspecified: Secondary | ICD-10-CM

## 2023-01-16 MED ORDER — AMOXICILLIN-POT CLAVULANATE 875-125 MG PO TABS: 1.0000 | ORAL_TABLET | Freq: Two times a day (BID) | ORAL | 0 refills | Status: AC

## 2023-01-16 MED ORDER — PROMETHAZINE-DM 6.25-15 MG/5ML PO SYRP: 5.0000 mL | ORAL_SOLUTION | Freq: Four times a day (QID) | ORAL | 0 refills | Status: DC | PRN

## 2023-01-16 NOTE — Progress Notes (Signed)
Virtual Visit Consent   Tavante Frizzell Capp, you are scheduled for a virtual visit with a [facility] provider today. Just as with appointments in the office, your consent must be obtained to participate. Your consent will be active for this visit and any virtual visit you may have with one of our providers in the next 365 days. If you have a MyChart account, a copy of this consent can be sent to you electronically.  As this is a virtual visit, video technology does not allow for your provider to perform a traditional examination. This may limit your provider's ability to fully assess your condition. If your provider identifies any concerns that need to be evaluated in person or the need to arrange testing (such as labs, EKG, etc.), we will make arrangements to do so. Although advances in technology are sophisticated, we cannot ensure that it will always work on either your end or our end. If the connection with a video visit is poor, the visit may have to be switched to a telephone visit. With either a video or telephone visit, we are not always able to ensure that we have a secure connection.  By engaging in this virtual visit, you consent to the provision of healthcare and authorize for your insurance to be billed (if applicable) for the services provided during this visit. Depending on your insurance coverage, you may receive a charge related to this service.  I need to obtain your verbal consent now. Are you willing to proceed with your visit today? Jakorey Culleton Farha has provided verbal consent on 01/16/2023 for a virtual visit (video or telephone). Claiborne Rigg, NP  Date: 01/16/2023 12:06 PM  Virtual Visit via Video Note   I, Claiborne Rigg, connected with  Gael Hinsdale [email or web address]  ([id number], 07-11-1980) on 01/16/23 at 12:00 PM EDT by a video-enabled telemedicine application and verified that I am speaking with the correct person using two identifiers.  Location: Patient: Virtual Visit  Location Patient: Home Provider: Virtual Visit Location Provider: Home Office   I discussed the limitations of evaluation and management by telemedicine and the availability of in person appointments. The patient expressed understanding and agreed to proceed.    History of Present Illness: Douglas Roberson is a 42 y.o. who identifies as a male who was assigned male at birth, and is being seen today for bacterial sinusitis.  Mr. Beesley complains of achiness, congestion, fever which has currently resolved, headache described as pressure, nasal congestion, non productive cough, post nasal drip, sinus pressure, sneezing, and sore throat. Onset of symptoms was a few weeks ago, unchanged since that time. He is drinking plenty of fluids.     Problems: There are no problems to display for this patient.   Allergies: No Known Allergies Medications:  Current Outpatient Medications:    amoxicillin-clavulanate (AUGMENTIN) 875-125 MG tablet, Take 1 tablet by mouth 2 (two) times daily for 7 days., Disp: 14 tablet, Rfl: 0   promethazine-dextromethorphan (PROMETHAZINE-DM) 6.25-15 MG/5ML syrup, Take 5 mLs by mouth 4 (four) times daily as needed for cough., Disp: 240 mL, Rfl: 0   diazepam (VALIUM) 5 MG tablet, Take 0.5-1 tablets (2.5-5 mg total) by mouth every 6 (six) hours as needed for muscle spasms or sedation., Disp: 40 tablet, Rfl: 1   naproxen (NAPROSYN) 500 MG tablet, Take 1 tablet (500 mg total) by mouth 2 (two) times daily with a meal., Disp: 60 tablet, Rfl: 1   ondansetron (ZOFRAN) 4 MG tablet, Take 1 tablet (  4 mg total) by mouth every 8 (eight) hours as needed for nausea or vomiting., Disp: 20 tablet, Rfl: 0   oxyCODONE-acetaminophen (PERCOCET) 5-325 MG per tablet, Take 1-2 tablets by mouth every 4 (four) hours as needed., Disp: 40 tablet, Rfl: 0  Observations/Objective: Patient is well-developed, well-nourished in no acute distress.  Resting comfortably at home.  Head is normocephalic,  atraumatic.  No labored breathing.  Speech is clear and coherent with logical content.  Patient is alert and oriented at baseline.    Assessment and Plan: 1. Acute bacterial sinusitis - amoxicillin-clavulanate (AUGMENTIN) 875-125 MG tablet; Take 1 tablet by mouth 2 (two) times daily for 7 days.  Dispense: 14 tablet; Refill: 0 - promethazine-dextromethorphan (PROMETHAZINE-DM) 6.25-15 MG/5ML syrup; Take 5 mLs by mouth 4 (four) times daily as needed for cough.  Dispense: 240 mL; Refill: 0  INSTRUCTIONS: use a humidifier for nasal congestion Drink plenty of fluids, rest and wash hands frequently to avoid the spread of infection Alternate tylenol and Motrin for relief of fever   Follow Up Instructions: I discussed the assessment and treatment plan with the patient. The patient was provided an opportunity to ask questions and all were answered. The patient agreed with the plan and demonstrated an understanding of the instructions.  A copy of instructions were sent to the patient via MyChart unless otherwise noted below.     The patient was advised to call back or seek an in-person evaluation if the symptoms worsen or if the condition fails to improve as anticipated.  Time:  I spent 11 minutes with the patient via telehealth technology discussing the above problems/concerns.    Claiborne Rigg, NP

## 2023-01-16 NOTE — Patient Instructions (Signed)
Douglas Roberson, thank you for joining Claiborne Rigg, NP for today's virtual visit.  While this provider is not your primary care provider (PCP), if your PCP is located in our provider database this encounter information will be shared with them immediately following your visit.   A [facility] MyChart account gives you access to today's visit and all your visits, tests, and labs performed at [facility] " click here if you don't have a [facility] MyChart account or go to [email or web address]  Consent: (Patient) Ha Krengel Malloy provided verbal consent for this virtual visit at the beginning of the encounter.  Current Medications:  Current Outpatient Medications:    amoxicillin-clavulanate (AUGMENTIN) 875-125 MG tablet, Take 1 tablet by mouth 2 (two) times daily for 7 days., Disp: 14 tablet, Rfl: 0   promethazine-dextromethorphan (PROMETHAZINE-DM) 6.25-15 MG/5ML syrup, Take 5 mLs by mouth 4 (four) times daily as needed for cough., Disp: 240 mL, Rfl: 0   diazepam (VALIUM) 5 MG tablet, Take 0.5-1 tablets (2.5-5 mg total) by mouth every 6 (six) hours as needed for muscle spasms or sedation., Disp: 40 tablet, Rfl: 1   naproxen (NAPROSYN) 500 MG tablet, Take 1 tablet (500 mg total) by mouth 2 (two) times daily with a meal., Disp: 60 tablet, Rfl: 1   ondansetron (ZOFRAN) 4 MG tablet, Take 1 tablet (4 mg total) by mouth every 8 (eight) hours as needed for nausea or vomiting., Disp: 20 tablet, Rfl: 0   oxyCODONE-acetaminophen (PERCOCET) 5-325 MG per tablet, Take 1-2 tablets by mouth every 4 (four) hours as needed., Disp: 40 tablet, Rfl: 0   Medications ordered in this encounter:  Meds ordered this encounter  Medications   amoxicillin-clavulanate (AUGMENTIN) 875-125 MG tablet    Sig: Take 1 tablet by mouth 2 (two) times daily for 7 days.    Dispense:  14 tablet    Refill:  0    Order Specific Question:   Supervising Provider    Answer:   Merrilee Jansky [id number]    promethazine-dextromethorphan (PROMETHAZINE-DM) 6.25-15 MG/5ML syrup    Sig: Take 5 mLs by mouth 4 (four) times daily as needed for cough.    Dispense:  240 mL    Refill:  0    Order Specific Question:   Supervising Provider    Answer:   Merrilee Jansky [[id number]]     *If you need refills on other medications prior to your next appointment, please contact your pharmacy*  Follow-Up: Call back or seek an in-person evaluation if the symptoms worsen or if the condition fails to improve as anticipated.  [facility] [phone]  Other Instructions INSTRUCTIONS: use a humidifier for nasal congestion Drink plenty of fluids, rest and wash hands frequently to avoid the spread of infection Alternate tylenol and Motrin for relief of fever    If you have been instructed to have an in-person evaluation today at a local Urgent Care facility, please use the link below. It will take you to a list of all of our available [facility], including address, phone number and hours of operation. Please do not delay care.  [facility]  If you or a family member do not have a primary care provider, use the link below to schedule a visit and establish care. When you choose a [facility] primary care physician or advanced practice provider, you gain a long-term partner in health. Find a Primary Care Provider  Learn more about [facility]'s  in-office and virtual care options:  - Get Care Now

## 2023-03-05 ENCOUNTER — Encounter: Payer: Self-pay | Admitting: Medical

## 2023-03-05 ENCOUNTER — Ambulatory Visit (INDEPENDENT_AMBULATORY_CARE_PROVIDER_SITE_OTHER): Payer: BC Managed Care – PPO | Admitting: Medical

## 2023-03-05 VITALS — BP 126/86 | HR 67 | Resp 18 | Ht 71.0 in | Wt 245.0 lb

## 2023-03-05 DIAGNOSIS — Z0000 Encounter for general adult medical examination without abnormal findings: Secondary | ICD-10-CM

## 2023-03-05 DIAGNOSIS — Z1283 Encounter for screening for malignant neoplasm of skin: Secondary | ICD-10-CM

## 2023-03-05 NOTE — Patient Instructions (Addendum)
For you wellness exam today I have ordered cbc, cmp and  lipid panel.  Vaccine declined today. Tdap. Declines flu vaccine  Recommend exercise and healthy diet.  We will let you know lab results as they come in.  Follow up date appointment will be determined after lab review.    Place referral to derm. Screening skin exam.  Preventive Care 29-40 Years Old, Male Preventive care refers to lifestyle choices and visits with your health care provider that can promote health and wellness. Preventive care visits are also called wellness exams. What can I expect for my preventive care visit? Counseling During your preventive care visit, your health care provider may ask about your: Medical history, including: Past medical problems. Family medical history. Current health, including: Emotional well-being. Home life and relationship well-being. Sexual activity. Lifestyle, including: Alcohol, nicotine or tobacco, and drug use. Access to firearms. Diet, exercise, and sleep habits. Safety issues such as seatbelt and bike helmet use. Sunscreen use. Work and work Astronomer. Physical exam Your health care provider will check your: Height and weight. These may be used to calculate your BMI (body mass index). BMI is a measurement that tells if you are at a healthy weight. Waist circumference. This measures the distance around your waistline. This measurement also tells if you are at a healthy weight and may help predict your risk of certain diseases, such as type 2 diabetes and high blood pressure. Heart rate and blood pressure. Body temperature. Skin for abnormal spots. What immunizations do I need?  Vaccines are usually given at various ages, according to a schedule. Your health care provider will recommend vaccines for you based on your age, medical history, and lifestyle or other factors, such as travel or where you work. What tests do I need? Screening Your health care provider may  recommend screening tests for certain conditions. This may include: Lipid and cholesterol levels. Diabetes screening. This is done by checking your blood sugar (glucose) after you have not eaten for a while (fasting). Hepatitis B test. Hepatitis C test. HIV (human immunodeficiency virus) test. STI (sexually transmitted infection) testing, if you are at risk. Lung cancer screening. Prostate cancer screening. Colorectal cancer screening. Talk with your health care provider about your test results, treatment options, and if necessary, the need for more tests. Follow these instructions at home: Eating and drinking  Eat a diet that includes fresh fruits and vegetables, whole grains, lean protein, and low-fat dairy products. Take vitamin and mineral supplements as recommended by your health care provider. Do not drink alcohol if your health care provider tells you not to drink. If you drink alcohol: Limit how much you have to 0-2 drinks a day. Know how much alcohol is in your drink. In the [place] one drink equals one 12 oz bottle of beer (355 mL), one 5 oz glass of wine (148 mL), or one 1 oz glass of hard liquor (44 mL). Lifestyle Brush your teeth every morning and night with fluoride toothpaste. Floss one time each day. Exercise for at least 30 minutes 5 or more days each week. Do not use any products that contain nicotine or tobacco. These products include cigarettes, chewing tobacco, and vaping devices, such as e-cigarettes. If you need help quitting, ask your health care provider. Do not use drugs. If you are sexually active, practice safe sex. Use a condom or other form of protection to prevent STIs. Take aspirin only as told by your health care provider. Make sure that you understand how  much to take and what form to take. Work with your health care provider to find out whether it is safe and beneficial for you to take aspirin daily. Find healthy ways to manage stress, such  as: Meditation, yoga, or listening to music. Journaling. Talking to a trusted person. Spending time with friends and family. Minimize exposure to UV radiation to reduce your risk of skin cancer. Safety Always wear your seat belt while driving or riding in a vehicle. Do not drive: If you have been drinking alcohol. Do not ride with someone who has been drinking. When you are tired or distracted. While texting. If you have been using any mind-altering substances or drugs. Wear a helmet and other protective equipment during sports activities. If you have firearms in your house, make sure you follow all gun safety procedures. What's next? Go to your health care provider once a year for an annual wellness visit. Ask your health care provider how often you should have your eyes and teeth checked. Stay up to date on all vaccines. This information is not intended to replace advice given to you by your health care provider. Make sure you discuss any questions you have with your health care provider. Document Revised: 10/09/2020 Document Reviewed: 10/09/2020 Elsevier Patient Education  2024 [employer].

## 2023-03-05 NOTE — Progress Notes (Signed)
Subjective:    Patient ID: Douglas Roberson, male    DOB: 1980-11-25, 42 y.o.   MRN: [id number]  HPI Pt in for first time. Pt is not fasting.  He wants to go ahead and labs. Decided to go ahead and do wellness exam    Pt worked Nurse, [employer], no regular exercise, admits not eating healthy, non smoker. No alcohol. No known med problems. 1-2 cups of soda a day. Mountain dew or root beer.  Hx of compression fracture L4-5 years ago. Occasional mild-moderate self limited low back pain. No present today.  Last tetanus in 2000. Defer presently.    Review of Systems  Constitutional:  Negative for chills and fever.  HENT:  Negative for congestion.   Respiratory:  Negative for cough, chest tightness and wheezing.   Cardiovascular:  Negative for chest pain and palpitations.  Gastrointestinal:  Negative for abdominal pain and constipation.  Musculoskeletal:  Negative for back pain and neck pain.  Skin:  Negative for rash.  Neurological:  Negative for dizziness and headaches.  Hematological:  Negative for adenopathy.  Psychiatric/Behavioral:  Negative for behavioral problems, decreased concentration, hallucinations and suicidal ideas.        Stress related to work.   Past Medical History:  Diagnosis Date   Seasonal allergies      Social History   Socioeconomic History   Marital status: Single    Spouse name: girlfirend for 20 years   Number of children: Not on file   Years of education: Not on file   Highest education level: Not on file  Occupational History   Not on file  Tobacco Use   Smoking status: Never   Smokeless tobacco: Never  Vaping Use   Vaping status: Never Used  Substance and Sexual Activity   Alcohol use: Yes    Comment: occasional. maybe on new year.   Drug use: No    Comment: delta h gummies.   Sexual activity: Yes  Other Topics Concern   Not on file  Social History Narrative   Not on file   Social Determinants of Health    Financial Resource Strain: Not on file  Food Insecurity: Not on file  Transportation Needs: Not on file  Physical Activity: Not on file  Stress: Not on file  Social Connections: Not on file  Intimate Partner Violence: Not on file    Past Surgical History:  Procedure Laterality Date   acl replaced Left    ruptures acl. patellar tendon   ANTERIOR CRUCIATE LIGAMENT REPAIR Left 11/08/2014   Procedure: LEFT KNEE ARTHROSCOPY AUTOGRAFT ANTERIOR CRUCIATE LIGAMENT (ACL) RECONSTRUCTION, POSSIBLE PARTIAL MENISECTOMY VS REPAIR;  Surgeon: Francena Hanly, MD;  Location: [facility] OR;  Service: Orthopedics;  Laterality: Left;   TONSILLECTOMY     WISDOM TOOTH EXTRACTION      Family History  Problem Relation Age of Onset   Hypertension Mother    CAD Mother     No Known Allergies  No current outpatient medications on file prior to visit.   No current facility-administered medications on file prior to visit.    BP 126/86   Pulse 67   Resp 18   Ht 5\' 11"  (1.803 m)   Wt 245 lb (111.1 kg)   SpO2 96%   BMI 34.17 kg/m        Objective:   Physical Exam  General Mental Status- Alert. General Appearance- Not in acute distress.   Skin Scattered small- moderate sized moles. Some freckles.  Upper back sebacious cyst.  Neck Carotid Arteries- Normal color. Moisture- Normal Moisture. No carotid bruits. No JVD.  Chest and Lung Exam Auscultation: Breath Sounds:-Normal.  Cardiovascular Auscultation:Rythm- Regular. Murmurs & Other Heart Sounds:Auscultation of the heart reveals- No Murmurs.  Abdomen Inspection:-Inspeection Normal. Palpation/Percussion:Note:No mass. Palpation and Percussion of the abdomen reveal- Non Tender, Non Distended + BS, no rebound or guarding.   Neurologic Cranial Nerve exam:- CN III-XII intact(No nystagmus), symmetric smile. Strength:- 5/5 equal and symmetric strength both upper and lower extremities.   Skin - various small to moderate sized mole on back.     Assessment & Plan:   Patient Instructions  For you wellness exam today I have ordered cbc, cmp and  lipid panel.  Vaccine declined today. Tdap. Declines flu vaccine  Recommend exercise and healthy diet.  We will let you know lab results as they come in.  Follow up date appointment will be determined after lab review.       Esperanza Richters, PA-C

## 2023-03-05 NOTE — Addendum Note (Signed)
Addended by: Mervin Kung A on: 03/05/2023 02:17 PM   Modules accepted: Orders

## 2023-03-06 LAB — COMPREHENSIVE METABOLIC PANEL
AG Ratio: 1.9 (calc) (ref 1.0–2.5)
ALT: 38 U/L (ref 9–46)
AST: 28 U/L (ref 10–40)
Albumin: 4.7 g/dL (ref 3.6–5.1)
Alkaline phosphatase (APISO): 83 U/L (ref 36–130)
BUN: 14 mg/dL (ref 7–25)
CO2: 27 mmol/L (ref 20–32)
Calcium: 9.8 mg/dL (ref 8.6–10.3)
Chloride: 102 mmol/L (ref 98–110)
Creat: 0.92 mg/dL (ref 0.60–1.29)
Globulin: 2.5 g/dL (ref 1.9–3.7)
Glucose, Bld: 83 mg/dL (ref 65–99)
Potassium: 4.2 mmol/L (ref 3.5–5.3)
Sodium: 141 mmol/L (ref 135–146)
Total Bilirubin: 0.8 mg/dL (ref 0.2–1.2)
Total Protein: 7.2 g/dL (ref 6.1–8.1)

## 2023-03-06 LAB — CBC WITH DIFFERENTIAL/PLATELET
Absolute Lymphocytes: 2912 {cells}/uL (ref 850–3900)
Absolute Monocytes: 905 {cells}/uL (ref 200–950)
Basophils Absolute: 73 {cells}/uL (ref 0–200)
Basophils Relative: 0.7 %
Eosinophils Absolute: 135 {cells}/uL (ref 15–500)
Eosinophils Relative: 1.3 %
HCT: 47.3 % (ref 38.5–50.0)
Hemoglobin: 16.2 g/dL (ref 13.2–17.1)
MCH: 30.9 pg (ref 27.0–33.0)
MCHC: 34.2 g/dL (ref 32.0–36.0)
MCV: 90.1 fL (ref 80.0–100.0)
MPV: 9.9 fL (ref 7.5–12.5)
Monocytes Relative: 8.7 %
Neutro Abs: 6375 {cells}/uL (ref 1500–7800)
Neutrophils Relative %: 61.3 %
Platelets: 264 10*3/uL (ref 140–400)
RBC: 5.25 10*6/uL (ref 4.20–5.80)
RDW: 11.8 % (ref 11.0–15.0)
Total Lymphocyte: 28 %
WBC: 10.4 10*3/uL (ref 3.8–10.8)

## 2023-03-06 LAB — LIPID PANEL
Cholesterol: 140 mg/dL (ref <200)
HDL: 47 mg/dL (ref >=40)
LDL Cholesterol (Calc): 74 mg/dL
Non-HDL Cholesterol (Calc): 93 mg/dL (ref <130)
Total CHOL/HDL Ratio: 3 (calc) (ref <5.0)
Triglycerides: 103 mg/dL (ref <150)

## 2023-08-06 ENCOUNTER — Encounter: Payer: Self-pay | Admitting: Medical

## 2023-08-06 ENCOUNTER — Ambulatory Visit: Admitting: Medical

## 2023-08-06 VITALS — BP 138/85 | HR 62 | Temp 98.0°F | Resp 18 | Ht 71.0 in | Wt 254.0 lb

## 2023-08-06 DIAGNOSIS — L600 Ingrowing nail: Secondary | ICD-10-CM | POA: Diagnosis not present

## 2023-08-06 MED ORDER — CEPHALEXIN 500 MG PO CAPS: 500.0000 mg | ORAL_CAPSULE | Freq: Two times a day (BID) | ORAL | 0 refills | Status: DC

## 2023-08-06 NOTE — Progress Notes (Signed)
   Subjective:    Patient ID: Douglas Roberson, male    DOB: [birth year], 43 y.o.   MRN: [id number]  HPI [name] "[name]" is a 43 year old male who presents with swelling and discharge from the left big toe.  He has been experiencing intermittent swelling and tenderness in his left big toe for a while. A few weeks ago, he noticed a discharge resembling blood from the area. The swelling and tenderness have been on and off, and the toenail has stopped growing. The base of the nail became red and swollen about two to three weeks ago.  He recalls a significant trauma to the toe approximately 15-20 years ago when it was run over by a 2000-pound pallet while working at [employer], resulting in the toenail falling off. Since then, he has experienced chronic discoloration and poor nail growth.  He experiences discomfort primarily when walking extensively, although it is not severe most of the time.  He has not been on any medication for this issue prior to the current visit.   Review of Systems See hpi    Objective:   Physical Exam  General- No acute distress. Pleasant patient. Neck- Full range of motion, no jvd Lungs- Clear, even and unlabored. Heart- regular rate and rhythm. Neurologic- CNII- XII grossly intact.  Left foot- Left great toes- medial aspect at base of nail pinkish red and mild tender. Dried scab in that area adjacent to nail.  Otherwise normal foot exam.      Assessment & Plan:   Patient Instructions  Infected ingrown toenail Infected left great toe toenail with swelling, tenderness, and sanguineous discharge. History of trauma and possible onychomycosis. Infection localized at lateral nail base. - Prescribed Keflex 500 mg orally twice daily for 7 days. - Advised warm saline soaks for foot twice daily for 8-10 minutes. - Referred to podiatrist for evaluation and potential nail portion removal post-antibiotic course. - Recommended acetaminophen for analgesia  as needed.  Elevated blood pressure Blood pressure elevated mild. No current antihypertensive medication. Lifestyle modifications advised. - Advise home blood pressure monitoring. - Recommend low sodium diet and increased physical activity.  General Health Maintenance Wellness exam completed last November. - Schedule wellness exam in November.  Follow-up November wellness or sooner if needed

## 2023-08-06 NOTE — Patient Instructions (Signed)
 Infected ingrown toenail Infected left great toe toenail with swelling, tenderness, and sanguineous discharge. History of trauma and possible onychomycosis. Infection localized at lateral nail base. - Prescribed Keflex 500 mg orally twice daily for 7 days. - Advised warm saline soaks for foot twice daily for 8-10 minutes. - Referred to podiatrist for evaluation and potential nail portion removal post-antibiotic course. - Recommended acetaminophen for analgesia as needed.  Elevated blood pressure Blood pressure elevated mild. No current antihypertensive medication. Lifestyle modifications advised. - Advise home blood pressure monitoring. - Recommend low sodium diet and increased physical activity.  General Health Maintenance Wellness exam completed last November. - Schedule wellness exam in November.  Follow-up November wellness or sooner if needed

## 2023-08-24 ENCOUNTER — Ambulatory Visit (INDEPENDENT_AMBULATORY_CARE_PROVIDER_SITE_OTHER): Admitting: Podiatry

## 2023-08-24 DIAGNOSIS — Q828 Other specified congenital malformations of skin: Secondary | ICD-10-CM | POA: Diagnosis not present

## 2023-08-24 DIAGNOSIS — M216X1 Other acquired deformities of right foot: Secondary | ICD-10-CM | POA: Diagnosis not present

## 2023-08-24 DIAGNOSIS — M216X2 Other acquired deformities of left foot: Secondary | ICD-10-CM

## 2023-08-24 NOTE — Progress Notes (Signed)
  Subjective:  Patient ID: Douglas Roberson, male    DOB: 1980/09/28,  MRN: [id number]  Chief Complaint  Patient presents with   Ingrown Toenail    43 y.o. male presents with the above complaint.  Patient presents with bilateral submetatarsal 3 porokeratotic lesion/benign skin lesion painful to touch is progressive gotten worse worse with ambulation with pressure has not seen MRIs prior to seeing me denies any other acute complaints would like to discuss treatment options.  Pain scale 7 out of 10 dull aching nature.  He states that has happened over time.  They have been present for many years now.   Review of Systems: Negative except as noted in the HPI. Denies N/V/F/Ch.  Past Medical History:  Diagnosis Date   Seasonal allergies     Current Outpatient Medications:    cephALEXin  (KEFLEX ) 500 MG capsule, Take 1 capsule (500 mg total) by mouth 2 (two) times daily., Disp: 14 capsule, Rfl: 0  Social History   Tobacco Use  Smoking Status Never  Smokeless Tobacco Never    No Known Allergies Objective:  There were no vitals filed for this visit. There is no height or weight on file to calculate BMI. Constitutional Well developed. Well nourished.  Vascular Dorsalis pedis pulses palpable bilaterally. Posterior tibial pulses palpable bilaterally. Capillary refill normal to all digits.  No cyanosis or clubbing noted. Pedal hair growth normal.  Neurologic Normal speech. Oriented to person, place, and time. Epicritic sensation to light touch grossly present bilaterally.  Dermatologic Bilateral submet 3 porokeratosis/hyperkeratotic lesion with underlying plantarflexed third metatarsal.  Mild pain on palpation.  No pinpoint bleeding noted upon debridement  Orthopedic: Normal joint ROM without pain or crepitus bilaterally. No visible deformities. No bony tenderness.   Radiographs: None Assessment:   1. Porokeratosis   2. Plantar flexed metatarsal, right   3. Plantar flexed  metatarsal, left    Plan:  Patient was evaluated and treated and all questions answered.  Bilateral submet 3 porokeratosis/plantarflexed metatarsal - All questions and concerns were discussed with the patient in extensive detail using chisel blade handle as a courtesy the lesion will debride down to healthy striated tissue no complication noted no pinpoint bleeding noted. - Shoe gear modification discussed - Orthotics discussed  No follow-ups on file.

## 2023-12-10 ENCOUNTER — Ambulatory Visit: Admitting: Medical

## 2024-01-10 ENCOUNTER — Ambulatory Visit (INDEPENDENT_AMBULATORY_CARE_PROVIDER_SITE_OTHER): Admitting: Medical

## 2024-01-10 ENCOUNTER — Encounter: Payer: Self-pay | Admitting: Medical

## 2024-01-10 VITALS — BP 137/80 | HR 63 | Temp 98.3°F | Resp 16 | Ht 71.0 in | Wt 248.0 lb

## 2024-01-10 DIAGNOSIS — H9202 Otalgia, left ear: Secondary | ICD-10-CM

## 2024-01-10 DIAGNOSIS — H6122 Impacted cerumen, left ear: Secondary | ICD-10-CM

## 2024-01-10 MED ORDER — AMOXICILLIN-POT CLAVULANATE 875-125 MG PO TABS: 1.0000 | ORAL_TABLET | Freq: Two times a day (BID) | ORAL | 0 refills | Status: AC

## 2024-01-10 NOTE — Patient Instructions (Addendum)
 Impacted  left cerumen and left ear pain Left ear pain with impacted cerumen obstructing eardrum view, complicating infection diagnosis. Differential includes possible ear infection, unconfirmed due to obstruction. Manual wax removal risks eardrum perforation.  - Prescribed augmentin  antibiotic for potential ear infection. - Advised Debrox ear drops three to four times daily to soften wax. - Instructed to lay on right side when applying Debrox to enhance soaking. - Advised against manual wax removal to prevent eardrum perforation. - Recommended irrigation after wax softens and discomfort decreases.  Follow up one week from today 1 pm or sooner if needed.

## 2024-01-10 NOTE — Progress Notes (Signed)
 Subjective:    Patient ID: Douglas Roberson, male    DOB: [birth year], 43 y.o.   MRN: [id number]  HPI Douglas Roberson is a 43 year old male who presents with left ear pain and hearing difficulties.  A few weeks ago, he noticed a significant amount of ear wax in his ear, which impaired his hearing. He used an over-the-counter irrigation kit, which resulted in increased pain and discomfort.  Since the irrigation, he has experienced constant ear pain radiating to his jaw, affecting his bite. This pain has persisted for about a week and a half. No nasal congestion or popping sensations in the jaw area are reported.  His hearing remains affected, and he occasionally feels like he is in a 'wind tunnel'. He sometimes uses a Q-tip to move the wax around, which temporarily improves his hearing.  No pain in his teeth is reported. No reported pain on opening his mouth but some pain when bites down in area in front of ear and in ear.   Review of Systems  Constitutional:  Negative for chills, fatigue and fever.  HENT:  Positive for ear pain. Negative for congestion, sneezing and sore throat.   Respiratory:  Negative for cough, chest tightness, shortness of breath and wheezing.   Cardiovascular:  Negative for chest pain and palpitations.  Gastrointestinal:  Negative for abdominal pain.  Musculoskeletal:  Negative for back pain and myalgias.  Neurological:  Negative for dizziness, syncope and light-headedness.  Hematological:  Negative for adenopathy. Does not bruise/bleed easily.  Psychiatric/Behavioral:  Negative for behavioral problems and decreased concentration.     Past Medical History:  Diagnosis Date   Seasonal allergies      Social History   Socioeconomic History   Marital status: Single    Spouse name: girlfirend for 20 years   Number of children: Not on file   Years of education: Not on file   Highest education level: Not on file  Occupational History   Not on  file  Tobacco Use   Smoking status: Never   Smokeless tobacco: Never  Vaping Use   Vaping status: Never Used  Substance and Sexual Activity   Alcohol use: Yes    Comment: occasional. maybe on new year.   Drug use: No    Comment: delta h gummies.   Sexual activity: Yes  Other Topics Concern   Not on file  Social History Narrative   Not on file   Social Drivers of Health   Financial Resource Strain: Not on file  Food Insecurity: Not on file  Transportation Needs: Not on file  Physical Activity: Not on file  Stress: Not on file  Social Connections: Not on file  Intimate Partner Violence: Not on file    Past Surgical History:  Procedure Laterality Date   acl replaced Left    ruptures acl. patellar tendon   ANTERIOR CRUCIATE LIGAMENT REPAIR Left 11/08/2014   Procedure: LEFT KNEE ARTHROSCOPY AUTOGRAFT ANTERIOR CRUCIATE LIGAMENT (ACL) RECONSTRUCTION, POSSIBLE PARTIAL MENISECTOMY VS REPAIR;  Surgeon: Franky Pointer, MD;  Location: [facility] OR;  Service: Orthopedics;  Laterality: Left;   TONSILLECTOMY     WISDOM TOOTH EXTRACTION      Family History  Problem Relation Age of Onset   Hypertension Mother    CAD Mother     No Known Allergies  No current outpatient medications on file prior to visit.   No current facility-administered medications on file prior to visit.    BP 137/80  Pulse 63   Temp 98.3 F (36.8 C) (Oral)   Resp 16   Ht 5' 11 (1.803 m)   Wt 248 lb (112.5 kg)   SpO2 95%   BMI 34.59 kg/m        Objective:   Physical Exam   General- No acute distress. Pleasant patient.  Lungs- Clear, even and unlabored. Heart- regular rate and rhythm. Neurologic- CNII- XII grossly intact.  HEENT: Cerumen impaction in left ear, unable to visualize tympanic membrane due to uniform wall of wax covering entire tm(wax appears deep) other portion of canal clear from wax. No tenderness on palpation of jaw, tmj area  and no pain behind the ear.    Assessment & Plan:    Patient Instructions  Impacted cerumen and left ear pain Left ear pain with impacted cerumen obstructing eardrum view, complicating infection diagnosis. Differential includes possible ear infection, unconfirmed due to obstruction. Manual wax removal risks eardrum perforation.  - Prescribed augmentin  antibiotic for potential ear infection. - Advised Debrox ear drops three to four times daily to soften wax. - Instructed to lay on right side when applying Debrox to enhance soaking. - Advised against manual wax removal to prevent eardrum perforation. - Recommended irrigation after wax softens and discomfort decreases.  Follow up one week from today 1 pm or sooner if needed.   Avory Rahimi, PA-C

## 2024-01-12 ENCOUNTER — Encounter: Payer: Self-pay | Admitting: Medical

## 2024-01-17 ENCOUNTER — Ambulatory Visit (INDEPENDENT_AMBULATORY_CARE_PROVIDER_SITE_OTHER): Admitting: Medical

## 2024-01-17 DIAGNOSIS — H6122 Impacted cerumen, left ear: Secondary | ICD-10-CM | POA: Diagnosis not present

## 2024-01-17 DIAGNOSIS — H6690 Otitis media, unspecified, unspecified ear: Secondary | ICD-10-CM

## 2024-01-17 DIAGNOSIS — I10 Essential (primary) hypertension: Secondary | ICD-10-CM

## 2024-01-17 MED ORDER — LOSARTAN POTASSIUM 25 MG PO TABS: 25.0000 mg | ORAL_TABLET | Freq: Every day | ORAL | 0 refills | Status: DC

## 2024-01-17 NOTE — Progress Notes (Signed)
 Subjective:    Patient ID: Douglas Roberson, male    DOB: Jun 17, 1980, 43 y.o.   MRN: [id number]  HPI  Douglas Roberson is a 43 year old male who presents with ear discomfort and high blood pressure.  He has been experiencing ear discomfort due to earwax buildup. He used Debrox, which helped remove several chunks of earwax, some measuring approximately three by two millimeters. The wax is described as 'solid little chunks' and has been reddish in color. Initially, the wax seemed to block his hearing, but after further use of Debrox, his hearing improved. No current ear pain or jaw pain, although he previously experienced jaw pain. He has been taking Augmentin  twice daily since last Monday and has three days of medication remaining.Given due to symptoms and concern for OM as on last visit no portion of TM seen.  He has been monitoring his blood pressure at home, with a reading of 174/107 on his machine, while a manual reading was 140/100 in our office before I rechecked. His blood pressure readings have been variable, with previous readings in 2016 around 126/80, and more recent readings ranging from 138/88 to 140/90. He has a family history of high blood pressure, as his mother has the condition.      Review of Systems  Constitutional:  Negative for chills, fatigue and fever.  HENT:  Negative for congestion and ear pain.   Respiratory:  Negative for cough, chest tightness, shortness of breath and wheezing.   Cardiovascular:  Negative for chest pain and palpitations.  Gastrointestinal:  Negative for abdominal pain, blood in stool and nausea.  Genitourinary:  Negative for enuresis.  Musculoskeletal:  Negative for back pain and myalgias.  Neurological:  Negative for dizziness, speech difficulty and headaches.  Hematological:  Negative for adenopathy.  Psychiatric/Behavioral:  Negative for behavioral problems and decreased concentration.     Past Medical History:   Diagnosis Date   Seasonal allergies      Social History   Socioeconomic History   Marital status: Single    Spouse name: girlfirend for 20 years   Number of children: Not on file   Years of education: Not on file   Highest education level: Not on file  Occupational History   Not on file  Tobacco Use   Smoking status: Never   Smokeless tobacco: Never  Vaping Use   Vaping status: Never Used  Substance and Sexual Activity   Alcohol use: Yes    Comment: occasional. maybe on new year.   Drug use: No    Comment: delta h gummies.   Sexual activity: Yes  Other Topics Concern   Not on file  Social History Narrative   Not on file   Social Drivers of Health   Financial Resource Strain: Not on file  Food Insecurity: Not on file  Transportation Needs: Not on file  Physical Activity: Not on file  Stress: Not on file  Social Connections: Not on file  Intimate Partner Violence: Not on file    Past Surgical History:  Procedure Laterality Date   acl replaced Left    ruptures acl. patellar tendon   ANTERIOR CRUCIATE LIGAMENT REPAIR Left 11/08/2014   Procedure: LEFT KNEE ARTHROSCOPY AUTOGRAFT ANTERIOR CRUCIATE LIGAMENT (ACL) RECONSTRUCTION, POSSIBLE PARTIAL MENISECTOMY VS REPAIR;  Surgeon: Franky Pointer, MD;  Location: [facility] OR;  Service: Orthopedics;  Laterality: Left;   TONSILLECTOMY     WISDOM TOOTH EXTRACTION      Family History  Problem Relation  Age of Onset   Hypertension Mother    CAD Mother     No Known Allergies  Current Outpatient Medications on File Prior to Visit  Medication Sig Dispense Refill   amoxicillin -clavulanate (AUGMENTIN ) 875-125 MG tablet Take 1 tablet by mouth 2 (two) times daily. 20 tablet 0   No current facility-administered medications on file prior to visit.    BP (!) 140/98   Pulse 72   Temp 98.2 F (36.8 C) (Oral)   Resp 14   Ht 5' 11 (1.803 m)   Wt 245 lb 3.2 oz (111.2 kg)   SpO2 95%   BMI 34.20 kg/m        Objective:    Physical Exam  General Mental Status- Alert. General Appearance- Not in acute distress.   Skin General: Color- Normal Color. Moisture- Normal Moisture.  Neck Carotid Arteries- Normal color. Moisture- Normal Moisture. No carotid bruits. No JVD.  Chest and Lung Exam Auscultation: Breath Sounds:-Normal.  Cardiovascular Auscultation:Rythm- Regular. Murmurs & Other Heart Sounds:Auscultation of the heart reveals- No Murmurs.  Abdomen Inspection:-Inspeection Normal. Palpation/Percussion:Note:No mass. Palpation and Percussion of the abdomen reveal- Non Tender, Non Distended + BS, no rebound or guarding.    Neurologic Cranial Nerve exam:- CN III-XII intact(No nystagmus), symmetric smile. Drift Test:- No drift. Finger to Nose:- Normal/Intact Strength:- 5/5 equal and symmetric strength both upper and lower extremities.       Assessment & Plan:   Patient Instructions  Right ear infection suspected post wax removal as upper 1/3 of tm mild red. Right ear shows acute otitis media with inflammation. Impacted cerumen removed with Debrox, alleviating symptoms. Eardrum redness suggests possible infection, treated with antibiotics. - Continue Augmentin  for three days. - Report any persistent ear discomfort post-antibiotics.  Cerumen impaction  -near 100% removal of wax. Only small piece  of wax at bottom of canal thin and not obstructing view of tm. Removal of that not indicated  Hypertension Blood pressure elevated at 140/90 mmHg and 142/98 mmHg. Family history of hypertension. Lifestyle modifications advised. Losartan  25 mg prescribed with reassessment in one month. - Initiate losartan  25 mg daily. -also decided to get cmp and lipid panel today. - Advise on a low-salt diet and regular exercise 3-5 days a week.  - Schedule follow-up in one month to reassess blood pressure and medication efficacy.   Amyri Frenz, PA-C

## 2024-01-17 NOTE — Patient Instructions (Addendum)
 Right ear infection suspected post wax removal as upper 1/3 of tm mild red. Right ear shows acute otitis media with inflammation. Impacted cerumen removed with Debrox, alleviating symptoms. Eardrum redness suggests possible infection, treated with antibiotics. - Continue Augmentin  for three days. - Report any persistent ear discomfort post-antibiotics.  Cerumen impaction  -near 100% removal of wax. Only small piece  of wax at bottom of canal thin and not obstructing view of tm. Removal of that not indicated  Hypertension Blood pressure elevated at 140/90 mmHg and 142/98 mmHg. Family history of hypertension. Lifestyle modifications advised. Losartan  25 mg prescribed with reassessment in one month. - Initiate losartan  25 mg daily. -also decided to get cmp and lipid panel today. - Advise on a low-salt diet and regular exercise 3-5 days a week.  - Schedule follow-up in one month to reassess blood pressure and medication efficacy.

## 2024-01-18 ENCOUNTER — Ambulatory Visit: Payer: Self-pay | Admitting: Medical

## 2024-01-18 LAB — LIPID PANEL
Cholesterol: 127 mg/dL (ref 0–200)
HDL: 39.4 mg/dL (ref >39.00)
LDL Cholesterol: 70 mg/dL (ref 0–99)
NonHDL: 87.47
Total CHOL/HDL Ratio: 3
Triglycerides: 85 mg/dL (ref 0.0–149.0)
VLDL: 17 mg/dL (ref 0.0–40.0)

## 2024-01-18 LAB — COMPLETE METABOLIC PANEL WITHOUT GFR
AG Ratio: 2.3 (calc) (ref 1.0–2.5)
ALT: 56 U/L — ABNORMAL HIGH (ref 9–46)
AST: 40 U/L (ref 10–40)
Albumin: 5 g/dL (ref 3.6–5.1)
Alkaline phosphatase (APISO): 81 U/L (ref 36–130)
BUN: 13 mg/dL (ref 7–25)
CO2: 28 mmol/L (ref 20–32)
Calcium: 9.9 mg/dL (ref 8.6–10.3)
Chloride: 103 mmol/L (ref 98–110)
Creat: 0.92 mg/dL (ref 0.60–1.29)
Globulin: 2.2 g/dL (ref 1.9–3.7)
Glucose, Bld: 89 mg/dL (ref 65–99)
Potassium: 4.1 mmol/L (ref 3.5–5.3)
Sodium: 140 mmol/L (ref 135–146)
Total Bilirubin: 1 mg/dL (ref 0.2–1.2)
Total Protein: 7.2 g/dL (ref 6.1–8.1)

## 2024-01-20 ENCOUNTER — Ambulatory Visit: Payer: BC Managed Care – PPO | Admitting: Dermatology

## 2024-02-01 ENCOUNTER — Ambulatory Visit: Admitting: Dermatology

## 2024-02-01 DIAGNOSIS — Q825 Congenital non-neoplastic nevus: Secondary | ICD-10-CM | POA: Diagnosis not present

## 2024-02-01 DIAGNOSIS — Z1283 Encounter for screening for malignant neoplasm of skin: Secondary | ICD-10-CM

## 2024-02-01 DIAGNOSIS — W908XXA Exposure to other nonionizing radiation, initial encounter: Secondary | ICD-10-CM | POA: Diagnosis not present

## 2024-02-01 DIAGNOSIS — L821 Other seborrheic keratosis: Secondary | ICD-10-CM

## 2024-02-01 DIAGNOSIS — L578 Other skin changes due to chronic exposure to nonionizing radiation: Secondary | ICD-10-CM | POA: Diagnosis not present

## 2024-02-01 DIAGNOSIS — L720 Epidermal cyst: Secondary | ICD-10-CM

## 2024-02-01 DIAGNOSIS — L858 Other specified epidermal thickening: Secondary | ICD-10-CM

## 2024-02-01 DIAGNOSIS — Z7189 Other specified counseling: Secondary | ICD-10-CM

## 2024-02-01 DIAGNOSIS — L729 Follicular cyst of the skin and subcutaneous tissue, unspecified: Secondary | ICD-10-CM

## 2024-02-01 DIAGNOSIS — L814 Other melanin hyperpigmentation: Secondary | ICD-10-CM

## 2024-02-01 DIAGNOSIS — D225 Melanocytic nevi of trunk: Secondary | ICD-10-CM

## 2024-02-01 DIAGNOSIS — D229 Melanocytic nevi, unspecified: Secondary | ICD-10-CM

## 2024-02-01 DIAGNOSIS — L719 Rosacea, unspecified: Secondary | ICD-10-CM

## 2024-02-01 NOTE — Patient Instructions (Signed)

## 2024-02-01 NOTE — Progress Notes (Unsigned)
 New Patient Visit   Subjective  Douglas Roberson is a 43 y.o. male who presents for the following: Skin Cancer Screening and Full Body Skin Exam Patient denies personal history of skin cancer and denies family history of skin cancer  Reports has multiple moles on back he would like checked.   The patient presents for Total-Body Skin Exam (TBSE) for skin cancer screening and mole check. The patient has spots, moles and lesions to be evaluated, some may be new or changing and the patient may have concern these could be cancer.  The following portions of the chart were reviewed this encounter and updated as appropriate: medications, allergies, medical history  Review of Systems:  No other skin or systemic complaints except as noted in HPI or Assessment and Plan.  Objective  Well appearing patient in no apparent distress; mood and affect are within normal limits.  A full examination was performed including scalp, head, eyes, ears, nose, lips, neck, chest, axillae, abdomen, back, buttocks, bilateral upper extremities, bilateral lower extremities, hands, feet, fingers, toes, fingernails, and toenails. All findings within normal limits unless otherwise noted below.   Relevant physical exam findings are noted in the Assessment and Plan.  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  ACTINIC DAMAGE - Chronic condition, secondary to cumulative UV/sun exposure - diffuse scaly erythematous macules with underlying dyspigmentation - Recommend daily broad spectrum sunscreen SPF 30+ to sun-exposed areas, reapply every 2 hours as needed.  - Staying in the shade or wearing long sleeves, sun glasses (UVA+UVB protection) and wide brim hats (4-inch brim around the entire circumference of the hat) are also  recommended for sun protection.  - Call for new or changing lesions.  LENTIGINES, SEBORRHEIC KERATOSES, HEMANGIOMAS - Benign normal skin lesions - Benign-appearing - Call for any changes  MELANOCYTIC NEVI - multiple moles at back see photos  - Tan-brown and/or pink-flesh-colored symmetric macules and papules - Benign appearing on exam today - Observation - Call clinic for new or changing moles - Recommend daily use of broad spectrum spf 30+ sunscreen to sun-exposed areas.   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  Stable. Observation.  Call clinic for new or changing moles.   Recommend daily use of broad spectrum spf 30+ sunscreen to sun-exposed areas.    ABCDEs of mole observation discussed and patient handout given.  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 Condition is symptomatic/ bothersome to patient. Not currently at goal. Rosacea is a chronic progressive skin condition usually affecting the face of adults, causing redness and/or acne bumps. It is treatable but not curable. It sometimes affects the eyes (ocular rosacea) as well. It may respond to topical and/or systemic medication and can flare with stress, sun exposure, alcohol, exercise, topical steroids (including hydrocortisone/cortisone 10) and some foods.  Daily application of broad spectrum spf 30+ sunscreen to face is recommended to reduce flares.  Patient denies grittiness of the eyes   Treatment Plan Discussed Counseling for BBL / IPL / Laser and Coordination of  Care Discussed the treatment option of Broad Band Light (BBL) /Intense Pulsed Light (IPL)/ Laser for skin discoloration, including brown spots and redness.  Typically we recommend at least 1-3  treatment sessions about 5-8 weeks apart for best results.  Cannot have tanned skin when BBL performed, and regular use of sunscreen/photoprotection is advised after the procedure to help maintain results. The patient's condition may also require maintenance treatments in the future.  The fee for BBL / laser treatments is $350 per treatment session for the whole face.  A fee can be quoted for other parts of the body.  Insurance typically does not pay for BBL/laser treatments and therefore the fee is an out-of-pocket cost. Recommend prophylactic valtrex treatment. Once scheduled for procedure, will send Rx in prior to patient's appointment.    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 Genetic in nature. No cure. - Observe. - If desired, patient can use an emollient (moisturizer) containing ammonium lactate (AmLactin), urea or salicylic acid once a day to smooth the area  Recommend starting moisturizer with exfoliant (Urea, Salicylic acid, or Lactic acid) one to two times daily to help smooth rough and bumpy skin.  OTC options include Cetaphil Rough and Bumpy lotion (Urea), Eucerin Roughness Relief lotion or spot treatment cream (Urea), CeraVe SA lotion/cream for Rough and Bumpy skin (Sal Acid), Gold Bond Rough and Bumpy cream (Sal Acid), and AmLactin 12% lotion/cream (Lactic Acid).  If applying in morning, also apply sunscreen to sun-exposed areas, since these exfoliating moisturizers can increase sensitivity to sun.    EPIDERMAL INCLUSION CYST Exam: Subcutaneous nodule at mid back 1.5 cm  Benign-appearing. Exam most consistent with an epidermal inclusion cyst. Discussed that a cyst is a benign growth that can grow over time and sometimes get irritated or inflamed. Recommend observation if it is not bothersome. Discussed option of surgical excision to remove it if it is growing, symptomatic, or other changes noted. Please call for new or changing lesions so  they can be evaluated.    Return for 2 year tbse.  IEleanor Blush, CMA, am acting as scribe for Alm Rhyme, MD.   Documentation: I have reviewed the above documentation for accuracy and completeness, and I agree with the above.  Alm Rhyme, MD

## 2024-02-03 ENCOUNTER — Encounter: Payer: Self-pay | Admitting: Dermatology

## 2024-02-11 ENCOUNTER — Other Ambulatory Visit: Payer: Self-pay | Admitting: Medical Genetics

## 2024-02-15 ENCOUNTER — Ambulatory Visit: Payer: Self-pay | Admitting: Medical

## 2024-02-15 ENCOUNTER — Ambulatory Visit (INDEPENDENT_AMBULATORY_CARE_PROVIDER_SITE_OTHER): Admitting: Medical

## 2024-02-15 VITALS — BP 124/82 | HR 64 | Temp 98.3°F | Resp 15 | Ht 71.0 in | Wt 239.0 lb

## 2024-02-15 DIAGNOSIS — R1024 Suprapubic pain: Secondary | ICD-10-CM

## 2024-02-15 DIAGNOSIS — R350 Frequency of micturition: Secondary | ICD-10-CM

## 2024-02-15 DIAGNOSIS — I10 Essential (primary) hypertension: Secondary | ICD-10-CM | POA: Diagnosis not present

## 2024-02-15 LAB — POC URINALSYSI DIPSTICK (AUTOMATED)
Bilirubin, UA: NEGATIVE
Blood, UA: NEGATIVE
Glucose, UA: NEGATIVE
Ketones, UA: NEGATIVE
Leukocytes, UA: NEGATIVE
Nitrite, UA: NEGATIVE
Protein, UA: NEGATIVE
Spec Grav, UA: <=1.005 — AB (ref 1.010–1.025)
Urobilinogen, UA: 0.2 U/dL
pH, UA: 6.5 (ref 5.0–8.0)

## 2024-02-15 MED ORDER — LOSARTAN POTASSIUM 25 MG PO TABS: 25.0000 mg | ORAL_TABLET | Freq: Every day | ORAL | 3 refills | Status: AC

## 2024-02-15 NOTE — Patient Instructions (Addendum)
  Intermittent suprapubic pressure and urinary frequency Symptoms improving. - Perform urinalysis and urine culture. - Order PSA test.  Hypertension Well-controlled with losartan , BP 124/82 mmHg, no side effects, normal kidney function and lipid panel. - Continue losartan , 90 tablets with three refills. - Monitor blood pressure at home once or twice every three weeks or if symptomatic.  Elevated liver enzyme, mild Mild elevation, possible fatty liver. Will continue to follow lft on future labs if in future worsen get US . Presently advise low fat diet and minimize any alcohol use. - Monitor liver enzymes every six months.  Follow up in 6 months or sooner if needed

## 2024-02-15 NOTE — Progress Notes (Signed)
 Subjective:    Patient ID: Douglas Roberson, male    DOB: [birth year], 43 y.o.   MRN: [id number]  HPI  Pt here for follow up.  Last visit [facility]   Right ear infection suspected post wax removal as upper 1/3 of tm mild red. Right ear shows acute otitis media with inflammation. Impacted cerumen removed with Debrox, alleviating symptoms. Eardrum redness suggests possible infection, treated with antibiotics. - Continue Augmentin  for three days. - Report any persistent ear discomfort post-antibiotics.   Cerumen impaction  -near 100% removal of wax. Only small piece  of wax at bottom of canal thin and not obstructing view of tm. Removal of that not indicated   Hypertension Blood pressure elevated at 140/90 mmHg and 142/98 mmHg. Family history of hypertension. Lifestyle modifications advised. Losartan  25 mg prescribed with reassessment in one month. - Initiate losartan  25 mg daily. -also decided to get cmp and lipid panel today. - Advise on a low-salt diet and regular exercise 3-5 days a week.   Douglas Roberson is a 43 year old male with hypertension who presents for follow-up of blood pressure management and urinary symptoms.  He has been taking losartan  without any side effects, and his blood pressure has improved significantly, now measuring 124/82 mmHg compared to previous readings in the 140 to 170 range systolic range. He uses a home blood pressure cuff and notes that his readings align closely with clinic measurements when his arm is positioned correctly.  He experiences frequent urination and a sensation of pressure over the bladder or suprapubic area for about five days. The symptom is intermittent and not consistently painful, described more as a pressure sensation. He believes the frequency of urination might be related to coffee consumption and acknowledges not increasing his water intake. No pain during urination, only pressure.  His right ear feels normal after  wax removal, with no current issues or hearing problems. Previously, the ear appeared slightly irritated.   Review of Systems  Constitutional:  Negative for chills and fever.  HENT:  Negative for congestion and ear discharge.   Respiratory:  Negative for choking, chest tightness, shortness of breath and wheezing.   Cardiovascular:  Negative for chest pain and palpitations.  Gastrointestinal:  Negative for abdominal pain.  Genitourinary:  Positive for frequency. Negative for dysuria and flank pain.       See hpi.  Neurological:  Negative for dizziness, syncope, weakness and light-headedness.  Hematological:  Negative for adenopathy.    Past Medical History:  Diagnosis Date   Seasonal allergies      Social History   Socioeconomic History   Marital status: Single    Spouse name: girlfirend for 20 years   Number of children: Not on file   Years of education: Not on file   Highest education level: Not on file  Occupational History   Not on file  Tobacco Use   Smoking status: Never   Smokeless tobacco: Never  Vaping Use   Vaping status: Never Used  Substance and Sexual Activity   Alcohol use: Yes    Comment: occasional. maybe on new year.   Drug use: No    Comment: delta h gummies.   Sexual activity: Yes  Other Topics Concern   Not on file  Social History Narrative   Not on file   Social Drivers of Health   Financial Resource Strain: Not on file  Food Insecurity: Not on file  Transportation Needs: Not on file  Physical  Activity: Not on file  Stress: Not on file  Social Connections: Not on file  Intimate Partner Violence: Not on file    Past Surgical History:  Procedure Laterality Date   acl replaced Left    ruptures acl. patellar tendon   ANTERIOR CRUCIATE LIGAMENT REPAIR Left 11/08/2014   Procedure: LEFT KNEE ARTHROSCOPY AUTOGRAFT ANTERIOR CRUCIATE LIGAMENT (ACL) RECONSTRUCTION, POSSIBLE PARTIAL MENISECTOMY VS REPAIR;  Surgeon: Franky Pointer, MD;  Location: [facility]  OR;  Service: Orthopedics;  Laterality: Left;   TONSILLECTOMY     WISDOM TOOTH EXTRACTION      Family History  Problem Relation Age of Onset   Hypertension Mother    CAD Mother     No Known Allergies  Current Outpatient Medications on File Prior to Visit  Medication Sig Dispense Refill   amoxicillin -clavulanate (AUGMENTIN ) 875-125 MG tablet Take 1 tablet by mouth 2 (two) times daily. 20 tablet 0   No current facility-administered medications on file prior to visit.    BP 124/82   Pulse 64   Temp 98.3 F (36.8 C) (Oral)   Resp 15   Ht 5' 11 (1.803 m)   Wt 239 lb (108.4 kg)   SpO2 97%   BMI 33.33 kg/m        Objective:   Physical Exam  General- No acute distress. Pleasant patient. Neck- Full range of motion, no jvd Lungs- Clear, even and unlabored. Heart- regular rate and rhythm. Neurologic- CNII- XII grossly intact.  Heent- canals clear and normal tms Back- no cva tenderness. Abd- soft, nt, nd,+bs      Assessment & Plan:   Patient Instructions   Intermittent suprapubic pressure and urinary frequency Symptoms improving. - Perform urinalysis and urine culture. - Order PSA test.  Hypertension Well-controlled with losartan , BP 124/82 mmHg, no side effects, normal kidney function and lipid panel. - Continue losartan , 90 tablets with three refills. - Monitor blood pressure at home once or twice every three weeks or if symptomatic.  Elevated liver enzyme, mild Mild elevation, possible fatty liver. Will continue to follow lft on future labs if in future worsen get US . Presently advise low fat diet and minimize any alcohol use. - Monitor liver enzymes every six months.  Follow up in 6 months or sooner if needed   [employer], PA-C

## 2024-02-16 LAB — URINE CULTURE
MICRO NUMBER:: 17127396
Result:: NO GROWTH
SPECIMEN QUALITY:: ADEQUATE

## 2024-02-16 LAB — PSA: PSA: 0.48 ng/mL (ref 0.10–4.00)

## 2024-03-28 ENCOUNTER — Encounter: Payer: Self-pay | Admitting: Medical

## 2024-03-28 ENCOUNTER — Other Ambulatory Visit

## 2024-03-28 DIAGNOSIS — Z006 Encounter for examination for normal comparison and control in clinical research program: Secondary | ICD-10-CM

## 2024-04-05 LAB — GENECONNECT MOLECULAR SCREEN: Genetic Analysis Overall Interpretation: NEGATIVE

## 2024-08-15 ENCOUNTER — Ambulatory Visit: Admitting: Medical

## 2025-07-31 ENCOUNTER — Ambulatory Visit: Admitting: Dermatology
